# Patient Record
Sex: Female | Born: 1945 | Race: White | Hispanic: No | Marital: Married | Smoking: Never smoker
Health system: Southern US, Community
[De-identification: ages and names within clinical notes are randomized; demographics above are authoritative.]

## PROBLEM LIST (undated history)

## (undated) DIAGNOSIS — C50919 Malignant neoplasm of unspecified site of unspecified female breast: Secondary | ICD-10-CM

## (undated) DIAGNOSIS — C801 Malignant (primary) neoplasm, unspecified: Secondary | ICD-10-CM

## (undated) DIAGNOSIS — M720 Palmar fascial fibromatosis [Dupuytren]: Principal | ICD-10-CM

## (undated) DIAGNOSIS — Z9889 Other specified postprocedural states: Secondary | ICD-10-CM

## (undated) DIAGNOSIS — Z923 Personal history of irradiation: Secondary | ICD-10-CM

## (undated) HISTORY — DX: Malignant (primary) neoplasm, unspecified: C80.1

## (undated) HISTORY — DX: Palmar fascial fibromatosis (dupuytren): M72.0

## (undated) HISTORY — DX: Other specified postprocedural states: Z98.890

---

## 2001-10-03 ENCOUNTER — Encounter: Payer: Self-pay | Admitting: Family Medicine

## 2001-10-03 ENCOUNTER — Encounter: Admission: RE | Admit: 2001-10-03 | Discharge: 2001-10-03 | Payer: Self-pay | Admitting: Family Medicine

## 2001-11-01 ENCOUNTER — Encounter: Payer: Self-pay | Admitting: Family Medicine

## 2001-11-01 ENCOUNTER — Encounter: Admission: RE | Admit: 2001-11-01 | Discharge: 2001-11-01 | Payer: Self-pay | Admitting: Family Medicine

## 2001-11-02 ENCOUNTER — Encounter: Admission: RE | Admit: 2001-11-02 | Discharge: 2001-11-02 | Payer: Self-pay | Admitting: Family Medicine

## 2001-11-02 ENCOUNTER — Encounter: Payer: Self-pay | Admitting: Family Medicine

## 2001-11-02 ENCOUNTER — Encounter (INDEPENDENT_AMBULATORY_CARE_PROVIDER_SITE_OTHER): Payer: Self-pay | Admitting: Specialist

## 2001-11-07 HISTORY — PX: BREAST LUMPECTOMY: SHX2

## 2001-11-16 ENCOUNTER — Ambulatory Visit (HOSPITAL_BASED_OUTPATIENT_CLINIC_OR_DEPARTMENT_OTHER): Admission: RE | Admit: 2001-11-16 | Discharge: 2001-11-16 | Payer: Self-pay | Admitting: General Surgery

## 2001-11-16 ENCOUNTER — Encounter (INDEPENDENT_AMBULATORY_CARE_PROVIDER_SITE_OTHER): Payer: Self-pay | Admitting: Specialist

## 2001-11-16 ENCOUNTER — Encounter: Admission: RE | Admit: 2001-11-16 | Discharge: 2001-11-16 | Payer: Self-pay | Admitting: General Surgery

## 2001-11-16 ENCOUNTER — Encounter: Payer: Self-pay | Admitting: General Surgery

## 2001-12-01 ENCOUNTER — Ambulatory Visit: Admission: RE | Admit: 2001-12-01 | Discharge: 2002-03-01 | Payer: Self-pay | Admitting: Radiation Oncology

## 2001-12-07 DIAGNOSIS — Z923 Personal history of irradiation: Secondary | ICD-10-CM

## 2001-12-07 HISTORY — DX: Personal history of irradiation: Z92.3

## 2002-06-19 ENCOUNTER — Encounter: Payer: Self-pay | Admitting: General Surgery

## 2002-06-19 ENCOUNTER — Encounter: Admission: RE | Admit: 2002-06-19 | Discharge: 2002-06-19 | Payer: Self-pay | Admitting: General Surgery

## 2002-08-14 ENCOUNTER — Ambulatory Visit: Admission: RE | Admit: 2002-08-14 | Discharge: 2002-08-14 | Payer: Self-pay | Admitting: Radiation Oncology

## 2003-06-24 ENCOUNTER — Encounter: Admission: RE | Admit: 2003-06-24 | Discharge: 2003-06-24 | Payer: Self-pay | Admitting: Family Medicine

## 2003-08-10 DIAGNOSIS — Z9889 Other specified postprocedural states: Secondary | ICD-10-CM

## 2003-08-10 HISTORY — DX: Other specified postprocedural states: Z98.890

## 2003-10-30 ENCOUNTER — Encounter: Admission: RE | Admit: 2003-10-30 | Discharge: 2003-10-30 | Payer: Self-pay | Admitting: Family Medicine

## 2003-12-05 ENCOUNTER — Ambulatory Visit (HOSPITAL_COMMUNITY): Admission: RE | Admit: 2003-12-05 | Discharge: 2003-12-05 | Payer: Self-pay | Admitting: Internal Medicine

## 2004-06-24 ENCOUNTER — Encounter: Admission: RE | Admit: 2004-06-24 | Discharge: 2004-06-24 | Payer: Self-pay | Admitting: Obstetrics & Gynecology

## 2004-06-25 ENCOUNTER — Encounter: Admission: RE | Admit: 2004-06-25 | Discharge: 2004-06-25 | Payer: Self-pay | Admitting: Family Medicine

## 2004-06-25 ENCOUNTER — Encounter (INDEPENDENT_AMBULATORY_CARE_PROVIDER_SITE_OTHER): Payer: Self-pay | Admitting: *Deleted

## 2004-06-25 HISTORY — PX: BREAST BIOPSY: SHX20

## 2005-07-21 ENCOUNTER — Encounter: Admission: RE | Admit: 2005-07-21 | Discharge: 2005-07-21 | Payer: Self-pay | Admitting: Family Medicine

## 2005-12-28 ENCOUNTER — Encounter: Admission: RE | Admit: 2005-12-28 | Discharge: 2005-12-28 | Payer: Self-pay | Admitting: Family Medicine

## 2006-07-22 ENCOUNTER — Encounter: Admission: RE | Admit: 2006-07-22 | Discharge: 2006-07-22 | Payer: Self-pay | Admitting: Family Medicine

## 2006-08-19 ENCOUNTER — Encounter: Admission: RE | Admit: 2006-08-19 | Discharge: 2006-08-19 | Payer: Self-pay | Admitting: Family Medicine

## 2006-08-24 ENCOUNTER — Encounter: Admission: RE | Admit: 2006-08-24 | Discharge: 2006-08-24 | Payer: Self-pay | Admitting: Family Medicine

## 2007-08-15 ENCOUNTER — Encounter: Admission: RE | Admit: 2007-08-15 | Discharge: 2007-08-15 | Payer: Self-pay | Admitting: Family Medicine

## 2008-10-28 ENCOUNTER — Encounter: Admission: RE | Admit: 2008-10-28 | Discharge: 2008-10-28 | Payer: Self-pay | Admitting: Family Medicine

## 2008-10-29 ENCOUNTER — Encounter: Admission: RE | Admit: 2008-10-29 | Discharge: 2008-10-29 | Payer: Self-pay | Admitting: Family Medicine

## 2008-10-29 ENCOUNTER — Encounter (INDEPENDENT_AMBULATORY_CARE_PROVIDER_SITE_OTHER): Payer: Self-pay | Admitting: Family Medicine

## 2008-10-29 HISTORY — PX: BREAST BIOPSY: SHX20

## 2009-02-03 ENCOUNTER — Encounter: Admission: RE | Admit: 2009-02-03 | Discharge: 2009-02-03 | Payer: Self-pay | Admitting: Family Medicine

## 2009-05-16 ENCOUNTER — Encounter: Payer: Self-pay | Admitting: Family Medicine

## 2010-04-09 ENCOUNTER — Encounter: Admission: RE | Admit: 2010-04-09 | Discharge: 2010-04-09 | Payer: Self-pay | Admitting: Family Medicine

## 2010-05-01 ENCOUNTER — Ambulatory Visit: Payer: Self-pay | Admitting: Family Medicine

## 2010-05-01 DIAGNOSIS — F4323 Adjustment disorder with mixed anxiety and depressed mood: Secondary | ICD-10-CM

## 2010-05-01 DIAGNOSIS — Z853 Personal history of malignant neoplasm of breast: Secondary | ICD-10-CM

## 2010-05-05 ENCOUNTER — Telehealth: Payer: Self-pay | Admitting: Family Medicine

## 2010-08-24 ENCOUNTER — Other Ambulatory Visit: Payer: Self-pay | Admitting: Family Medicine

## 2010-08-24 ENCOUNTER — Ambulatory Visit
Admission: RE | Admit: 2010-08-24 | Discharge: 2010-08-24 | Payer: Self-pay | Source: Home / Self Care | Attending: Family Medicine | Admitting: Family Medicine

## 2010-08-24 ENCOUNTER — Telehealth (INDEPENDENT_AMBULATORY_CARE_PROVIDER_SITE_OTHER): Payer: Self-pay | Admitting: *Deleted

## 2010-08-24 LAB — BASIC METABOLIC PANEL
BUN: 14 mg/dL (ref 6–23)
CO2: 28 mEq/L (ref 19–32)
Calcium: 9.2 mg/dL (ref 8.4–10.5)
Chloride: 105 mEq/L (ref 96–112)
Creatinine, Ser: 0.8 mg/dL (ref 0.4–1.2)
GFR: 74.65 mL/min (ref >60.00)
Glucose, Bld: 102 mg/dL — ABNORMAL HIGH (ref 70–99)
Potassium: 4.2 mEq/L (ref 3.5–5.1)
Sodium: 140 mEq/L (ref 135–145)

## 2010-08-24 LAB — LIPID PANEL
Cholesterol: 220 mg/dL — ABNORMAL HIGH (ref 0–200)
HDL: 51.1 mg/dL (ref >39.00)
Total CHOL/HDL Ratio: 4
Triglycerides: 87 mg/dL (ref 0.0–149.0)
VLDL: 17.4 mg/dL (ref 0.0–40.0)

## 2010-08-24 LAB — LDL CHOLESTEROL, DIRECT: Direct LDL: 169.1 mg/dL

## 2010-08-26 ENCOUNTER — Ambulatory Visit
Admission: RE | Admit: 2010-08-26 | Discharge: 2010-08-26 | Payer: Self-pay | Source: Home / Self Care | Attending: Family Medicine | Admitting: Family Medicine

## 2010-08-26 ENCOUNTER — Other Ambulatory Visit
Admission: RE | Admit: 2010-08-26 | Discharge: 2010-08-26 | Payer: Self-pay | Source: Home / Self Care | Admitting: Family Medicine

## 2010-08-26 ENCOUNTER — Encounter (INDEPENDENT_AMBULATORY_CARE_PROVIDER_SITE_OTHER): Payer: Self-pay | Admitting: *Deleted

## 2010-08-26 ENCOUNTER — Other Ambulatory Visit: Payer: Self-pay | Admitting: Family Medicine

## 2010-08-26 DIAGNOSIS — E785 Hyperlipidemia, unspecified: Secondary | ICD-10-CM | POA: Insufficient documentation

## 2010-08-30 ENCOUNTER — Encounter: Payer: Self-pay | Admitting: Family Medicine

## 2010-08-31 ENCOUNTER — Encounter: Payer: Self-pay | Admitting: Family Medicine

## 2010-09-08 NOTE — Assessment & Plan Note (Signed)
Summary: NEW PT TO EST/CLE   Vital Signs:  Patient profile:   65 year old female Height:      67.5 inches Weight:      199 pounds BMI:     30.82 Temp:     97.8 degrees F oral Pulse rate:   64 / minute Pulse rhythm:   regular BP sitting:   140 / 80  (right arm) Cuff size:   regular  Vitals Entered By: Linde Gillis CMA Duncan Dull) (18-May-2010 1:54 PM) CC: new patient, establish care   History of Present Illness: 65 yo here to establish care.  Anxiety/depression- recently retired as an Print production planner.  Worked for [employer] for over 35 years and was used to the [employer].  Since retiring recently, feels like she has lost purpose.  [name] more tearful and sad.  Trying to stay occupied but definitely feels anxious about her new situation. Sometimes has difficulty sleeping.  Eating ok.  No SI or HI.  Never had issues like this in past.  Has a great support system and trying to stay very active and keep her mind sharp.  Well woman- knows she is due for colonoscopy and pap smear but is not sure what she else she has had done (awaiting records).  Preventive Screening-Counseling & Management  Alcohol-Tobacco     Smoking Status: never  Caffeine-Diet-Exercise     Does Patient Exercise: yes      Drug Use:  no.    Current Medications (verified): 1)  Prozac 20 Mg Caps (Fluoxetine Hcl) .Marland Kitchen.. 1 Tab By Mouth Daily.  Allergies (verified): No Known Drug Allergies  Past History:  Family History: Last updated: May 18, 2010 Mom- alzheimers Dad- died of Colon CA at 5.  Social History: Last updated: 18-May-2010 Retired Never Smoked Drug use-no Regular exercise-yes  Risk Factors: Exercise: yes (2010-05-18)  Risk Factors: Smoking Status: never (May 18, 2010)  Past Medical History: Breast cancer, hx of 2005, s/p lumpectomy and radiation   Past Surgical History: Lumpectomy  Family History: Mom- alzheimers Dad- died of Colon CA at 3.  Social  History: Retired Never Smoked Drug use-no Regular exercise-yes Smoking Status:  never Drug Use:  no Does Patient Exercise:  yes  Review of Systems      See HPI General:  Denies malaise. Eyes:  Denies blurring. ENT:  Denies difficulty swallowing. CV:  Denies chest pain or discomfort. Resp:  Denies shortness of breath. GI:  Denies abdominal pain, bloody stools, and change in bowel habits. GU:  Denies decreased libido, dysuria, urinary frequency, and urinary hesitancy. MS:  Denies joint pain, joint redness, and joint swelling. Derm:  Denies rash. Neuro:  Denies headaches. Psych:  Complains of anxiety, depression, and easily tearful; denies easily angered, irritability, mental problems, panic attacks, sense of great danger, suicidal thoughts/plans, thoughts of violence, unusual visions or sounds, and thoughts /plans of harming others. Endo:  Denies cold intolerance and heat intolerance. Heme:  Denies abnormal bruising and bleeding.  Physical Exam  General:  Well-developed,well-nourished,in no acute distress; alert,appropriate and cooperative throughout examination Head:  normocephalic, atraumatic, and no abnormalities observed.   Eyes:  vision grossly intact, pupils equal, pupils round, and pupils reactive to light.   Ears:  R ear normal and L ear normal.   Nose:  no external deformity.   Mouth:  good dentition.   Lungs:  Normal respiratory effort, chest expands symmetrically. Lungs are clear to auscultation, no crackles or wheezes. Heart:  Normal rate and regular rhythm. S1  and S2 normal without gallop, murmur, click, rub or other extra sounds. Abdomen:  Bowel sounds positive,abdomen soft and non-tender without masses, organomegaly or hernias noted. Msk:  No deformity or scoliosis noted of thoracic or lumbar spine.   Extremities:  No clubbing, cyanosis, edema, or deformity noted with normal full range of motion of all joints.   Neurologic:  alert & oriented X3 and gait normal.    Skin:  Intact without suspicious lesions or rashes Psych:  normally interactive, good eye contact, and not anxious appearing.     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  Discussed psychotherapy with Dr. Laymond Purser.  [name] very interested but would like to wait a few months.  WIll start Prozac 20 mg daily, follow up in one month.    Problem # 2:  Preventive Health Care (ICD-V70.0) Assessment: Comment Only Set up colonoscopy today. Flu shot today. Make appt for CPX/Pap. Orders: Gastroenterology Referral (GI)  Complete Medication List: 1)  Prozac 20 Mg Caps (Fluoxetine hcl) .Marland Kitchen.. 1 tab by mouth daily.  Patient Instructions: 1)  Great to meet you, Ms. Palm. 2)  Please make an appointment for a complete physical/pap in one month. 3)  Have a wonderful weekend. 4)  Please stop by to see Shirlee Limerick on your way out to set up your colonscopy. Prescriptions: PROZAC 20 MG CAPS (FLUOXETINE HCL) 1 tab by mouth daily.  #30 x 0   Entered and Authorized by:   Ruthe Mannan MD   Signed by:   Ruthe Mannan MD on 05/01/2010   Method used:   Print then Give to Patient   RxID:   [id number]   Prior Medications (reviewed today): None Current Allergies (reviewed today): No known allergies    Prevention & Chronic Care Immunizations   Influenza vaccine: Not documented    Tetanus booster: Not documented    Pneumococcal vaccine: Not documented    H. zoster vaccine: Not documented  Colorectal Screening   Hemoccult: Not documented    Colonoscopy: Not documented   Colonoscopy action/deferral: GI referral  (05/01/2010)  Other Screening   Pap smear: Not documented    Mammogram: BI-RADS CATEGORY 2:  Benign finding(s).^MM DIGITAL DIAGNOSTIC BILAT  (04/09/2010)   Mammogram due: 04/10/2011    DXA  bone density scan: Not documented   Smoking status: never  (05/01/2010)  Lipids   Total Cholesterol: Not documented   LDL: Not documented   LDL Direct: Not documented   HDL: Not documented   Triglycerides: Not documented   Nursing Instructions: GI referral for screening colonoscopy (see order)    Appended Document: NEW PT TO EST/CLE     Allergies: No Known Drug Allergies   Complete Medication List: 1)  Prozac 20 Mg Caps (Fluoxetine hcl) .Marland Kitchen.. 1 tab by mouth daily.  Other Orders: Flu Vaccine 58yrs + (56213) Admin 1st Vaccine (08657)    Immunizations Administered:  Influenza Vaccine # 1:    Vaccine Type: Fluvax 3+    Site: left deltoid    Mfr: GlaxoSmithKline    Dose: 0.5 ml    Route: IM    Given by: Linde Gillis CMA (AAMA)    Exp. Date: 02/06/2011    Lot #: [id number]    VIS given: 03/03/10 version given May 01, 2010.  Appended Document: NEW PT TO EST/CLE  Allergies (verified): No Known Drug Allergies   Complete Medication List: 1)  Prozac 20 Mg Caps (Fluoxetine hcl) .Marland Kitchen.. 1 tab by mouth daily. 2)  Red Yeast Rice 600 Mg Caps (Red yeast rice extract) .... Take one tablet by mouth daily 3)  Vitamin D3 1000 Unit Caps (Cholecalciferol) .... Take one tablet by mouth daily 4)  Fish Oil 1000 Mg Caps (Omega-3 fatty acids) .... Take one tablet by mouth daily 5)  Folic Acid 800 Mcg Tabs (Folic acid) .... Take one tablet by mouth daily 6)  Vitamin E 400 Unit Caps (Vitamin e) .... Take one tablet by mouth daily 7)  Ra Iron 27 Mg Tabs (Ferrous sulfate) .... Take one tablet by mouth daily 8)  Aspirin 81 Mg Tabs (Aspirin) .... Take one tablet by mouth daily   Current Allergies (reviewed today): No known allergies

## 2010-09-08 NOTE — Progress Notes (Signed)
  Phone Note Call from Patient   Caller: Patient Summary of Call: Called patient to schedule the Screening colonoscopy that was ordered at New Pt visit. She said she has changed her mind and does not want to schedule this at this time. Should we cancel the referral in patients record? Please advise?  Initial call taken by: Carlton Adam,  May 05, 2010 9:06 AM  Follow-up for Phone Call        I guess we can just cancel the referral. Ruthe Mannan MD  May 05, 2010 9:15 AM

## 2010-09-08 NOTE — Letter (Signed)
Summary: Records from [facility] 2007 - 2010  Records from [facility] 2007 - 2010   Imported By: Maryln Gottron 05/29/2010 15:25:15  _____________________________________________________________________  External Attachment:    Type:   Image     Comment:   External Document

## 2010-09-10 NOTE — Letter (Signed)
Summary: Results Follow up Letter  [place] [facility]  [address] [place], [place] [address]   Phone: [phone]  Fax: [phone]    08/31/2010 MRN: [id number]  [facility] [address] [place], [place]  [address]  Dear Ms. Vroom,  The following are the results of your recent test(s):  Test         Result    Pap Smear:        Normal __X___  Not Normal _____ Comments: ______________________________________________________ Cholesterol: LDL(Bad cholesterol):         Your goal is less than:         HDL (Good cholesterol):       Your goal is more than: Comments:  ______________________________________________________ Mammogram:        Normal _____  Not Normal _____ Comments:  ___________________________________________________________________ Hemoccult:        Normal _____  Not normal _______ Comments:    _____________________________________________________________________ Other Tests:    We routinely do not discuss normal results over the telephone.  If you desire a copy of the results, or you have any questions about this information we can discuss them at your next office visit.   Sincerely,      Dr. Ruthe Mannan

## 2010-09-10 NOTE — Assessment & Plan Note (Signed)
Summary: CPX/CLE   Vital Signs:  Patient profile:   65 year old female Height:      67.5 inches Weight:      202.25 pounds BMI:     31.32 Temp:     97.6 degrees F oral Pulse rate:   68 / minute Pulse rhythm:   regular BP sitting:   120 / 80  (right arm) Cuff size:   regular  Vitals Entered By: Linde Gillis CMA Duncan Dull) (August 26, 2010 9:22 AM) CC: complete physicial   History of Present Illness: 65 yo here for CPX.  Adjustment disorder- improved, no longer taking Prozac. Feels like she is coping better.  Well woman- knows she is due for colonoscopy and pap smear but is not sure what she else she has had done. Denies any abnormal pap smears in past and denies any vaginal discharge, irritation or dysuria.  HLD- LDL this month 169, otherwise lipid panel fanastic.  Has never been on statin in past.  Does take Red yeast rice.  Current Medications (verified): 1)  Red Yeast Rice 600 Mg Caps (Red Yeast Rice Extract) .... Take One Tablet By Mouth Daily 2)  Vitamin D3 1000 Unit Caps (Cholecalciferol) .... Take One Tablet By Mouth Daily 3)  Fish Oil 1000 Mg Caps (Omega-3 Fatty Acids) .... Take One Tablet By Mouth Daily 4)  Folic Acid 800 Mcg Tabs (Folic Acid) .... Take One Tablet By Mouth Daily 5)  Vitamin E 400 Unit Caps (Vitamin E) .... Take One Tablet By Mouth Daily 6)  Ra Iron 27 Mg Tabs (Ferrous Sulfate) .... Take One Tablet By Mouth Daily 7)  Aspirin 81 Mg Tabs (Aspirin) .... Take One Tablet By Mouth Daily 8)  Simvastatin 10 Mg Tabs (Simvastatin) .Marland Kitchen.. 1 By Mouth At Bedtime  Allergies (verified): No Known Drug Allergies  Past History:  Past Medical History: Last updated: 05/06/2010 Breast cancer, hx of 2005, s/p lumpectomy and radiation   Past Surgical History: Last updated: 05-06-10 Lumpectomy  Family History: Last updated: May 06, 2010 Mom- alzheimers Dad- died of Colon CA at 78.  Social History: Last updated: 05-06-2010 Retired Never Smoked Drug  use-no Regular exercise-yes  Risk Factors: Exercise: yes (05-06-10)  Risk Factors: Smoking Status: never (05-06-2010)  Review of Systems      See HPI General:  Denies malaise. Eyes:  Denies blurring. ENT:  Denies difficulty swallowing. CV:  Denies chest pain or discomfort. Resp:  Denies shortness of breath. GI:  Denies abdominal pain, bloody stools, and change in bowel habits. GU:  Denies discharge and dysuria. MS:  Denies joint pain, joint redness, and joint swelling. Derm:  Denies rash. Neuro:  Denies headaches. Psych:  Denies anxiety and depression. Endo:  Denies cold intolerance and heat intolerance. Heme:  Denies abnormal bruising and bleeding.  Physical Exam  General:  Well-developed,well-nourished,in no acute distress; alert,appropriate and cooperative throughout examination Head:  normocephalic, atraumatic, and no abnormalities observed.   Eyes:  vision grossly intact, pupils equal, pupils round, and pupils reactive to light.   Ears:  R ear normal and L ear normal.   Nose:  no external deformity.   Mouth:  good dentition.   Neck:  No deformities, masses, or tenderness noted. Breasts:  chronic  thickening at base of breasts bilaterally, no tenderness, bulging, retraction, inflamation, nipple discharge or skin changes noted.   Lungs:  Normal respiratory effort, chest expands symmetrically. Lungs are clear to auscultation, no crackles or wheezes. Heart:  Normal rate and regular rhythm. S1 and S2  normal without gallop, murmur, click, rub or other extra sounds. Abdomen:  Bowel sounds positive,abdomen soft and non-tender without masses, organomegaly or hernias noted. Rectal:  no external abnormalities.   Genitalia:  Pelvic Exam:        External: normal female genitalia without lesions or masses        Vagina: normal without lesions or masses        Cervix: normal without lesions or masses        Adnexa: normal bimanual exam without masses or fullness        Uterus:  normal by palpation        Pap smear: performed Msk:  No deformity or scoliosis noted of thoracic or lumbar spine.   Extremities:  No clubbing, cyanosis, edema, or deformity noted with normal full range of motion of all joints.   Neurologic:  No cranial nerve deficits noted. Station and gait are normal. Plantar reflexes are down-going bilaterally. DTRs are symmetrical throughout. Sensory, motor and coordinative functions appear intact. Skin:  Intact without suspicious lesions or rashes Psych:  Cognition and judgment appear intact. Alert and cooperative with normal attention span and concentration. No apparent delusions, illusions, hallucinations   Impression & Recommendations:  Problem # 1:  Preventive Health Care (ICD-V70.0) Reviewed preventive care protocols, scheduled due services, and updated immunizations Discussed nutrition, exercise, diet, and healthy lifestyle.  Pap smear today, refer for colonoscopy today.  Problem # 2:  HYPERLIPIDEMIA (ICD-272.4) Assessment: New Start Simvastatin 10 mg daily today, follow up labs in 8 weeks. Her updated medication list for this problem includes:    Simvastatin 10 Mg Tabs (Simvastatin) .Marland Kitchen... 1 by mouth at bedtime  Complete Medication List: 1)  Red Yeast Rice 600 Mg Caps (Red yeast rice extract) .... Take one tablet by mouth daily 2)  Vitamin D3 1000 Unit Caps (Cholecalciferol) .... Take one tablet by mouth daily 3)  Fish Oil 1000 Mg Caps (Omega-3 fatty acids) .... Take one tablet by mouth daily 4)  Folic Acid 800 Mcg Tabs (Folic acid) .... Take one tablet by mouth daily 5)  Vitamin E 400 Unit Caps (Vitamin e) .... Take one tablet by mouth daily 6)  Ra Iron 27 Mg Tabs (Ferrous sulfate) .... Take one tablet by mouth daily 7)  Aspirin 81 Mg Tabs (Aspirin) .... Take one tablet by mouth daily 8)  Simvastatin 10 Mg Tabs (Simvastatin) .Marland Kitchen.. 1 by mouth at bedtime  Other Orders: Gastroenterology Referral (GI)  Patient Instructions: 1)  Great to  see you. 2)  We will start Simvastatin 10 mg daily for your cholesterol. 3)  Come back for labs fasting (not eating) in 8 weeks- fasting lipid panel, hepatic panel (272.4). 4)  After you get your insurance, let's get the Shingles vaccination. 5)  Please stop by to see Shirlee Limerick on your way out to set up your colonoscopy. Prescriptions: SIMVASTATIN 10 MG TABS (SIMVASTATIN) 1 by mouth at bedtime  #90 x 3   Entered and Authorized by:   Ruthe Mannan MD   Signed by:   Ruthe Mannan MD on 08/26/2010   Method used:   Electronically to        [facility] (retail)       [address]       [place], [place]  [id number]       Ph: [phone]       Fax: [phone]   RxID:   [id number]    Orders Added: 1)  Gastroenterology Referral [  GI] 2)  Est. Patient 40-64 years [99396]    Current Allergies (reviewed today): No known allergies     Prevention & Chronic Care Immunizations   Influenza vaccine: Fluvax 3+  (05/01/2010)   Influenza vaccine due: 05/02/2011    Tetanus booster: Not documented    Pneumococcal vaccine: Not documented    H. zoster vaccine: Not documented   H. zoster vaccine deferral: Deferred  (08/26/2010)  Colorectal Screening   Hemoccult: Not documented   Hemoccult action/deferral: Not indicated  (08/26/2010)    Colonoscopy: Not documented   Colonoscopy action/deferral: GI referral  (08/26/2010)  Other Screening   Pap smear: Not documented   Pap smear action/deferral: Ordered  (08/26/2010)    Mammogram: BI-RADS CATEGORY 2:  Benign finding(s).^MM DIGITAL DIAGNOSTIC BILAT  (04/09/2010)   Mammogram due: 04/10/2011    DXA bone density scan: Not documented   Smoking status: never  (05/01/2010)  Lipids   Total Cholesterol: 220  (08/24/2010)   LDL: Not documented   LDL Direct: 169.1  (08/24/2010)   HDL: 51.10  (08/24/2010)   Triglycerides: 87.0  (08/24/2010)    SGOT (AST): Not documented   SGPT (ALT): Not documented   Alkaline  phosphatase: Not documented   Total bilirubin: Not documented  Self-Management Support :    Lipid self-management support: Not documented    Nursing Instructions: GI referral for screening colonoscopy (see order) Pap smear today

## 2010-09-10 NOTE — Letter (Signed)
Summary: Pre Visit Letter Revised  [facility]  [address] [place], [place] [address]   Phone: [phone]  Fax: [phone]        08/26/2010 MRN: [id number] Brandy Wyatt [address] [place], [place]  [address]             Procedure Date:  October 01, 2010   dir col-Dr Justin Mend to the Gastroenterology Division at [facility].    You are scheduled to see a nurse for your pre-procedure visit on September 15, 2010 at 9:00am on the 3rd floor at [facility], [address]  We ask that you try to arrive at our office 15 minutes prior to your appointment time to allow for check-in.  Please take a minute to review the attached form.  If you answer "Yes" to one or more of the questions on the first page, we ask that you call the person listed at your earliest opportunity.  If you answer "No" to all of the questions, please complete the rest of the form and bring it to your appointment.    Your nurse visit will consist of discussing your medical and surgical history, your immediate family medical history, and your medications.   If you are unable to list all of your medications on the form, please bring the medication bottles to your appointment and we will list them.  We will need to be aware of both prescribed and over the counter drugs.  We will need to know exact dosage information as well.    Please be prepared to read and sign documents such as consent forms, a financial agreement, and acknowledgement forms.  If necessary, and with your consent, a friend or relative is welcome to sit-in on the nurse visit with you.  Please bring your insurance card so that we may make a copy of it.  If your insurance requires a referral to see a specialist, please bring your referral form from your primary care physician.  No co-pay is required for this nurse visit.     If you cannot keep your appointment, please call [phone] to cancel or reschedule prior to  your appointment date.  This allows [place] the opportunity to schedule an appointment for another patient in need of care.    Thank you for choosing [facility] for your medical needs.  We appreciate the opportunity to care for you.  Please visit [place] at our website  to learn more about our practice.  Sincerely, The Gastroenterology Division

## 2010-09-10 NOTE — Progress Notes (Signed)
----   Converted from flag ---- ---- 08/24/2010 7:47 AM, Ruthe Mannan MD wrote: good morning!!! v70.0 (BMET), fasting lipid panel (v81.0)  ---- 08/24/2010 7:40 AM, Liane Comber CMA (AAMA) wrote: Lab orders please! Good Morning! This pt is scheduled for cpx labs this am, which labs to draw and dx codes to use? Thanks Tasha ------------------------------

## 2010-09-29 ENCOUNTER — Other Ambulatory Visit: Payer: Self-pay | Admitting: Internal Medicine

## 2010-10-12 ENCOUNTER — Other Ambulatory Visit: Payer: 59

## 2010-10-12 ENCOUNTER — Other Ambulatory Visit: Payer: Self-pay | Admitting: Internal Medicine

## 2010-12-25 NOTE — Op Note (Signed)
[facility]  Patient:    TAJHA, SAMMARCO Visit Number: [id number] MRN: [id number]          Service Type: DSU Location: [facility] Attending Physician:  Janalyn Rouse Dictated by:   Rose Phi. Maple Hudson, M.D. Proc. Date: 11/16/01 Admit Date:  11/16/2001   CC:         Lupe Carney, M.D.   Operative Report  PREOPERATIVE DIAGNOSIS:  Intraductal carcinoma of the right breast.  POSTOPERATIVE DIAGNOSIS:  Intraductal carcinoma of the right breast.  OPERATION:  Wide local excision with needle localization and specimen mammography.  SURGEON:  Rose Phi. Maple Hudson, M.D.  ANESTHESIA:  General.  OPERATIVE PROCEDURE:  This patient had a mammographic abnormality at the 5-6 oclock position of her right breast which, on core biopsy, had shown DCIS. [name] scheduled now for a wide excision or partial mastectomy.  After suitable general anesthesia was induced, the patient was placed in the supine position with the right arm extended on the arm board. The right breast was prepped and draped in the usual fashion. Curvilinear incision was then outlined, using the wire as the guide, and then excision made and a wide excision of the wire and its surrounding tissue carried out. Hemostasis obtained with electrocautery. Specimen and mammography confirmed the removal of the lesion. Specimen had been oriented for the pathologist in case the margins were not clear. Subcuticular closure of 4-0 Monocryl and Steri-Strips carried out. Dressing was applied. The patient was transferred The patient was taken to the recovery room in stable condition. The patient tolerated the procedure well. Dictated by:   Rose Phi. Maple Hudson, M.D. Attending Physician:  Janalyn Rouse DD:  11/16/01 TD:  11/17/01 Job: [id number]

## 2011-01-27 ENCOUNTER — Encounter: Payer: Self-pay | Admitting: Family Medicine

## 2011-01-29 ENCOUNTER — Encounter: Payer: 59 | Admitting: Family Medicine

## 2011-01-29 NOTE — Progress Notes (Signed)
Cancelled.  

## 2011-05-12 ENCOUNTER — Other Ambulatory Visit: Payer: Self-pay | Admitting: Family Medicine

## 2011-05-12 DIAGNOSIS — Z1231 Encounter for screening mammogram for malignant neoplasm of breast: Secondary | ICD-10-CM

## 2011-05-19 ENCOUNTER — Ambulatory Visit: Payer: 59

## 2011-07-28 ENCOUNTER — Ambulatory Visit
Admission: RE | Admit: 2011-07-28 | Discharge: 2011-07-28 | Disposition: A | Discharge status: Home / Self Care | Payer: Private Health Insurance - Indemnity | Source: Ambulatory Visit | Attending: Family Medicine | Admitting: Family Medicine

## 2011-07-28 DIAGNOSIS — Z1231 Encounter for screening mammogram for malignant neoplasm of breast: Secondary | ICD-10-CM

## 2011-07-29 ENCOUNTER — Encounter: Payer: Self-pay | Admitting: *Deleted

## 2012-04-05 ENCOUNTER — Ambulatory Visit: Payer: Private Health Insurance - Indemnity | Admitting: Family Medicine

## 2012-04-06 ENCOUNTER — Ambulatory Visit (INDEPENDENT_AMBULATORY_CARE_PROVIDER_SITE_OTHER): Payer: Medicare Other | Admitting: Family Medicine

## 2012-04-06 ENCOUNTER — Encounter: Payer: Self-pay | Admitting: Family Medicine

## 2012-04-06 VITALS — BP 122/80 | Temp 98.0°F | Wt 201.0 lb

## 2012-04-06 DIAGNOSIS — F329 Major depressive disorder, single episode, unspecified: Secondary | ICD-10-CM

## 2012-04-06 DIAGNOSIS — F3289 Other specified depressive episodes: Secondary | ICD-10-CM

## 2012-04-06 MED ORDER — CITALOPRAM HYDROBROMIDE 10 MG PO TABS: 10.0000 mg | ORAL_TABLET | Freq: Every day | ORAL | Status: DC

## 2012-04-06 NOTE — Patient Instructions (Addendum)
Great to see you. We are starting Celexa. Please call me in 3-4 weeks with an update.  Please stop by to see Shirlee Limerick on your way out to set up your appointment with Dr. Laymond Purser.

## 2012-04-06 NOTE — Progress Notes (Signed)
  Subjective:    Patient ID: Brandy Wyatt, female    DOB: [birth year], 66 y.o.   MRN: [id number]  HPI  Very pleasant 66 yo female here with daughter to discuss memory loss.  Since she retired, she has noticed that she forgets things more easily. Never forgets names of people she knows or how to get places. Still balances her checkbook without difficulty.  [name] concerned because her mother had dementia later in life.  Admits to losing interest in things since she retired. [name] often tearful and anxious and never used to be anxious. Sleeping ok. Appetite ok. No SI or HI.  mini-mental status exam 30/30.  Patient Active Problem List  Diagnosis  . ADJUSTMENT DISORDER WITH MIXED FEATURES  . BREAST CANCER, HX OF  . HYPERLIPIDEMIA  . Depression   Past Medical History  Diagnosis Date  . S/P lumpectomy of breast 2005    hx of breast cancer   Past Surgical History  Procedure Date  . Breast lumpectomy    History  Substance Use Topics  . Smoking status: Never Smoker   . Smokeless tobacco: Not on file  . Alcohol Use:    Family History  Problem Relation Age of Onset  . Cancer Father 55    colon   No Known Allergies Current Outpatient Prescriptions on File Prior to Visit  Medication Sig Dispense Refill  . aspirin 81 MG tablet Take 81 mg by mouth daily.        . Cholecalciferol (VITAMIN D3) 1000 UNITS CAPS Take 1 capsule by mouth daily.        . Ferrous Sulfate (RA IRON) 27 MG TABS Take 1 tablet by mouth daily.        . folic acid (FOLVITE) 800 MCG tablet Take 800 mcg by mouth daily.        . Omega-3 Fatty Acids (FISH OIL) 1000 MG CAPS Take 1 capsule by mouth daily.        . Red Yeast Rice 600 MG TABS Take 1 tablet by mouth daily.        . simvastatin (ZOCOR) 10 MG tablet Take 10 mg by mouth at bedtime.        . vitamin E 400 UNIT capsule Take 400 Units by mouth daily.        . citalopram (CELEXA) 10 MG tablet Take 1 tablet (10 mg total) by mouth daily.  30 tablet  1    The PMH, PSH, Social History, Family History, Medications, and allergies have been reviewed in [facility], and have been updated if relevant.    Review of Systems    See HPI Objective:   Physical Exam BP 122/80  Temp 98 F (36.7 C)  Wt 201 lb (91.173 kg) Gen:  Alert, pleasant, tearful Psych:  Good eye contact and though organization. Tearful but appropriate.    Assessment & Plan:   1. Depression  Ambulatory referral to Psychology   >25 min spent with face to face with patient, >50% counseling and/or coordinating care Most consistent with pseudodementia. Start celexa 10 mg daily, refer to Dr. Laymond Purser for psychotherapy. Follow up in 1 month. The patient indicates understanding of these issues and agrees with the plan.

## 2012-04-07 ENCOUNTER — Telehealth: Payer: Self-pay | Admitting: Family Medicine

## 2012-04-07 MED ORDER — ONDANSETRON HCL 4 MG PO TABS: 4.0000 mg | ORAL_TABLET | Freq: Three times a day (TID) | ORAL | Status: AC | PRN

## 2012-04-07 NOTE — Telephone Encounter (Signed)
Caller: Tam/Child; Patient Name: Brandy Wyatt; PCP: Ruthe Mannan [facility]); Best Callback Phone Number: [phone].  Call regarding Nausea, onset 8-30, after starting Celexa for Depression diagnosed on 8-29.  Daughter wanting to know if Zofran can be called in.  Per Daughter, MD advised Mom nausea would be normal side effect.  Patient is not with Daughter for assessment.  Daughter will pick up script for Patient due to Patient sleeping.   [facility], [employer], [phone]. Please follow up with Daughter.

## 2012-04-07 NOTE — Telephone Encounter (Signed)
Would be reasonable to try zofran in meantime.  rx sent.  Please call them.   Thanks.

## 2012-04-07 NOTE — Telephone Encounter (Signed)
Daughter advised.

## 2012-06-12 ENCOUNTER — Other Ambulatory Visit: Payer: Self-pay | Admitting: Family Medicine

## 2012-06-12 ENCOUNTER — Telehealth: Payer: Self-pay

## 2012-06-12 NOTE — Telephone Encounter (Signed)
Pt's daughter called to request refill Celexa; [facility] has already filled and Johny Drilling or pt will call for f/u appt . Johny Drilling said today is third day pt been off med(pt did not know should get refilled thought when finished prescription she was done).Pharmacist advised Johny Drilling to wait until AM and then start Celexa again; Johny Drilling wanted Dr Elmer Sow opinion if could restart this afternoon.Please advise.

## 2012-06-12 NOTE — Telephone Encounter (Signed)
I'm not sure I understand the question?

## 2012-06-12 NOTE — Telephone Encounter (Signed)
Since patient has been off celexa for 3 days her daughter is asking if ok to restart tonight or wait until tomorrow morning.  Per Dr. Patsy Lager advised daughter to have patient restart celexa tomorrow morning.

## 2012-06-13 NOTE — Telephone Encounter (Signed)
Noted! Thank you

## 2012-07-25 ENCOUNTER — Ambulatory Visit (INDEPENDENT_AMBULATORY_CARE_PROVIDER_SITE_OTHER): Payer: Medicare Other | Admitting: Family Medicine

## 2012-07-25 ENCOUNTER — Encounter: Payer: Self-pay | Admitting: Family Medicine

## 2012-07-25 VITALS — BP 132/78 | HR 64 | Temp 98.2°F | Wt 200.2 lb

## 2012-07-25 DIAGNOSIS — R0981 Nasal congestion: Secondary | ICD-10-CM | POA: Insufficient documentation

## 2012-07-25 DIAGNOSIS — J3489 Other specified disorders of nose and nasal sinuses: Secondary | ICD-10-CM | POA: Diagnosis not present

## 2012-07-25 MED ORDER — FLUTICASONE PROPIONATE 50 MCG/ACT NA SUSP: 2.0000 | Freq: Every day | NASAL | Status: DC

## 2012-07-25 NOTE — Assessment & Plan Note (Signed)
4d h/o significant sinus congestion that started after raking leaves, associated with sinus pressure headache and cough. Anticipate more allergic component - see pt instructions for treatment plan. If deteriorating, or not better by 1 wk, low threshold to treat possible sinusitis component with abx. discussed all this with pt who agrees with plan.

## 2012-07-25 NOTE — Progress Notes (Signed)
  Subjective:    Patient ID: Brandy Wyatt, female    DOB: 24-Aug-1945, 66 y.o.   MRN: [id number]  HPI CC: allergies?  4 d h/o sinus congestion that started after raking leaves.  Significant head congestion, sinus pressure HA - currently with frontal sinus pressure.  All stopped up.  ST started today.  + cough worse with laying down.  No fevers/chills, abd pain, ear or tooth pain, PNdrainage, rhinorrhea.  Has tried vicks in nose, vaporizer as well as cough meds and cough drops  No sick contacts at home.  Husband with similar congestion (he has h/o allergies). No smokers at home. No h/o asthma. No h/o allergy issues. No h/o sinus infection in past.  Past Medical History  Diagnosis Date  . S/P lumpectomy of breast 2005    hx of breast cancer     Review of Systems per HPI    Objective:   Physical Exam  Nursing note and vitals reviewed. Constitutional: She appears well-developed and well-nourished. No distress.  HENT:  Head: Normocephalic and atraumatic.  Right Ear: Hearing, tympanic membrane, external ear and ear canal normal.  Left Ear: Hearing, tympanic membrane, external ear and ear canal normal.  Nose: Mucosal edema present. No rhinorrhea. Right sinus exhibits no maxillary sinus tenderness and no frontal sinus tenderness. Left sinus exhibits no maxillary sinus tenderness and no frontal sinus tenderness.  Mouth/Throat: Uvula is midline, oropharynx is clear and moist and mucous membranes are normal. No oropharyngeal exudate, posterior oropharyngeal edema, posterior oropharyngeal erythema or tonsillar abscesses.       Pale and boggy turbinates bilaterally  Eyes: Conjunctivae normal and EOM are normal. Pupils are equal, round, and reactive to light. No scleral icterus.  Neck: Normal range of motion. Neck supple.  Cardiovascular: Normal rate, regular rhythm, normal heart sounds and intact distal pulses.   No murmur heard. Pulmonary/Chest: Effort normal and breath sounds normal. No  respiratory distress. She has no wheezes. She has no rales.  Lymphadenopathy:    She has no cervical adenopathy.  Skin: Skin is warm and dry. No rash noted.       Assessment & Plan:

## 2012-07-25 NOTE — Patient Instructions (Signed)
You have significant sinus congestion, likely allergic. Take medicine as prescribed: flonase nasal steroid - watch for nosebleeds on this med, if happen then stop. Push fluids and plenty of rest. Nasal saline irrigation or neti pot to help drain sinuses. May use simple mucinex with plenty of fluid to help mobilize mucous. Let us know if fever >101.5, trouble opening/closing mouth, difficulty swallowing, or worsening. If not improving by Monday, call us for antibiotic course.

## 2012-09-11 ENCOUNTER — Other Ambulatory Visit: Payer: Self-pay | Admitting: *Deleted

## 2012-09-11 MED ORDER — CITALOPRAM HYDROBROMIDE 10 MG PO TABS: ORAL_TABLET | ORAL | Status: DC

## 2012-09-12 ENCOUNTER — Ambulatory Visit (INDEPENDENT_AMBULATORY_CARE_PROVIDER_SITE_OTHER)
Admission: RE | Admit: 2012-09-12 | Discharge: 2012-09-12 | Disposition: A | Discharge status: Home / Self Care | Payer: Medicare Other | Source: Ambulatory Visit | Attending: Family Medicine | Admitting: Family Medicine

## 2012-09-12 ENCOUNTER — Encounter: Payer: Self-pay | Admitting: Family Medicine

## 2012-09-12 ENCOUNTER — Ambulatory Visit (INDEPENDENT_AMBULATORY_CARE_PROVIDER_SITE_OTHER): Payer: Medicare Other | Admitting: Family Medicine

## 2012-09-12 ENCOUNTER — Other Ambulatory Visit: Payer: Self-pay | Admitting: Family Medicine

## 2012-09-12 VITALS — BP 124/78 | HR 64 | Temp 97.6°F | Wt 199.8 lb

## 2012-09-12 DIAGNOSIS — R52 Pain, unspecified: Secondary | ICD-10-CM

## 2012-09-12 DIAGNOSIS — R109 Unspecified abdominal pain: Secondary | ICD-10-CM | POA: Diagnosis not present

## 2012-09-12 DIAGNOSIS — R1084 Generalized abdominal pain: Secondary | ICD-10-CM | POA: Insufficient documentation

## 2012-09-12 DIAGNOSIS — N2889 Other specified disorders of kidney and ureter: Secondary | ICD-10-CM | POA: Diagnosis not present

## 2012-09-12 LAB — COMPREHENSIVE METABOLIC PANEL
ALT: 21 U/L (ref 0–35)
BUN: 11 mg/dL (ref 6–23)
CO2: 30 mEq/L (ref 19–32)
Calcium: 9.3 mg/dL (ref 8.4–10.5)
Chloride: 105 mEq/L (ref 96–112)
Creatinine, Ser: 0.8 mg/dL (ref 0.4–1.2)
GFR: 75.23 mL/min (ref >60.00)
Glucose, Bld: 130 mg/dL — ABNORMAL HIGH (ref 70–99)

## 2012-09-12 LAB — CBC WITH DIFFERENTIAL/PLATELET
Basophils Absolute: 0 10*3/uL (ref 0.0–0.1)
Eosinophils Relative: 2.6 % (ref 0.0–5.0)
Hemoglobin: 14.3 g/dL (ref 12.0–15.0)
Lymphocytes Relative: 26.6 % (ref 12.0–46.0)
Monocytes Relative: 8.4 % (ref 3.0–12.0)
Neutro Abs: 3.3 10*3/uL (ref 1.4–7.7)
RBC: 4.77 Mil/uL (ref 3.87–5.11)
RDW: 12.9 % (ref 11.5–14.6)
WBC: 5.3 10*3/uL (ref 4.5–10.5)

## 2012-09-12 LAB — POCT URINALYSIS DIPSTICK
Blood, UA: NEGATIVE
Nitrite, UA: NEGATIVE
Protein, UA: NEGATIVE
Spec Grav, UA: >=1.03

## 2012-09-12 LAB — LIPASE: Lipase: 25 U/L (ref 11.0–59.0)

## 2012-09-12 MED ORDER — OMEPRAZOLE 40 MG PO CPDR: 40.0000 mg | DELAYED_RELEASE_CAPSULE | Freq: Every day | ORAL | Status: DC

## 2012-09-12 MED ORDER — IOHEXOL 300 MG/ML  SOLN
100.0000 mL | Freq: Once | INTRAMUSCULAR | Status: AC | PRN
  Administered 2012-09-12: 100 mL via INTRAVENOUS

## 2012-09-12 NOTE — Addendum Note (Signed)
Addended by: Eustaquio Boyden on: 09/12/2012 01:25 PM   Modules accepted: Orders

## 2012-09-12 NOTE — Progress Notes (Signed)
  Subjective:    Patient ID: Brandy Wyatt, female    DOB: 03/25/1946, 67 y.o.   MRN: [id number]  HPI CC: abd bloating  2 wk h/o abdominal bloating.  Getting more painful mid abdomen and radiating to sides.  Describes burning pain "on fire".  Occasional nausea.  Using heating pad which helped some.  Normal bowels regular daily, no changes there.  Passing gas normally.  Denies diarrhea or constipation, vomiting. No fevers/chills, indigestion or reflux or heartburn.  No gassiness.  No dysuria or urgency or other urinary symptoms.  Just bloated.  Occasional back pain.  So far has tried gas X and beano which didn't help at all. No new foods or supplements.  Off simvastatin, on read yeast rice.  Wt Readings from Last 3 Encounters:  09/12/12 199 lb 12 oz (90.606 kg)  07/25/12 200 lb 4 oz (90.833 kg)  04/06/12 201 lb (91.173 kg)    Past Medical History  Diagnosis Date  . S/P lumpectomy of breast 2005    hx of breast cancer    Past Surgical History  Procedure Date  . Breast lumpectomy     Review of Systems Per HPI    Objective:   Physical Exam  Nursing note and vitals reviewed. Constitutional: She appears well-developed and well-nourished. No distress.  HENT:  Head: Normocephalic and atraumatic.  Mouth/Throat: Oropharynx is clear and moist. No oropharyngeal exudate.  Eyes: Conjunctivae normal and EOM are normal. Pupils are equal, round, and reactive to light. No scleral icterus.  Neck: Normal range of motion. Neck supple.  Cardiovascular: Normal rate, regular rhythm, normal heart sounds and intact distal pulses.   No murmur heard. Pulmonary/Chest: Effort normal and breath sounds normal. No respiratory distress. She has no wheezes. She has no rales.  Abdominal: Soft. Normal appearance and bowel sounds are normal. She exhibits no distension and no mass. There is no hepatosplenomegaly. There is generalized tenderness. There is guarding. There is no rigidity, no rebound, no CVA  tenderness and negative Murphy's sign.       Obese Involuntary guarding with palpation epigastrically and RLQ.  Musculoskeletal: She exhibits no edema.  Skin: Skin is warm and dry. No rash noted.       Assessment & Plan:

## 2012-09-12 NOTE — Assessment & Plan Note (Addendum)
With bloating, going on 2 wks but worsening recently. Check STAT blood work today. Will need CT scan today given concerning abd exam of moderate pain and guarding - eval for appendicitis, gallbladder pathology, colitis. Less likely dyspepsia. We will call pt with results. UA concerning for infection - sent off culture.

## 2012-09-12 NOTE — Patient Instructions (Signed)
I am worried about this abdominal pain - pass by lab today and then by Marion's office to get CT scan today.

## 2012-09-12 NOTE — Addendum Note (Signed)
Addended by: Eustaquio Boyden on: 09/12/2012 02:05 PM   Modules accepted: Orders

## 2012-09-13 LAB — URINE CULTURE: Organism ID, Bacteria: NO GROWTH

## 2012-11-08 ENCOUNTER — Other Ambulatory Visit: Payer: Self-pay

## 2012-11-08 MED ORDER — OMEPRAZOLE 40 MG PO CPDR: 40.0000 mg | DELAYED_RELEASE_CAPSULE | Freq: Every day | ORAL | Status: DC

## 2012-11-08 NOTE — Telephone Encounter (Signed)
[facility] faxed refill omeprazole 40 mg. Last filled 10/09/12. Pt seen 09/12/12 by Dr Reece Agar with abdominal pain.Please advise.

## 2012-12-11 ENCOUNTER — Other Ambulatory Visit: Payer: Self-pay | Admitting: *Deleted

## 2012-12-11 MED ORDER — CITALOPRAM HYDROBROMIDE 10 MG PO TABS: ORAL_TABLET | ORAL | Status: DC

## 2013-01-08 ENCOUNTER — Other Ambulatory Visit: Payer: Self-pay | Admitting: *Deleted

## 2013-01-08 MED ORDER — OMEPRAZOLE 40 MG PO CPDR: 40.0000 mg | DELAYED_RELEASE_CAPSULE | Freq: Every day | ORAL | Status: DC

## 2013-05-15 ENCOUNTER — Telehealth: Payer: Self-pay

## 2013-05-15 NOTE — Telephone Encounter (Signed)
Pt's daughter Thurmond Butts) and son Darneshia Demary) would like to meet with you if possible without the patient.  Her daughter Johny Drilling made her a doctor appointment with you, but they also want to see you separately.

## 2013-05-15 NOTE — Telephone Encounter (Signed)
Yes ok to make a 30 minute appointment on another day.  I am seeing their mother tomorrow. If necessary, I can come back from the nursing home early on Friday to talk with them.  Please also make sure that pt has authorized me to speak with them.

## 2013-05-15 NOTE — Telephone Encounter (Signed)
Brandy Wyatt pts son left v/m concerned pt mental status has changed; pt on depression med but pt's family noticing signs of dementia, cannot do simple task such as starting her car; can't remember to turn key, pt got lost when going shopping; Brandy Wyatt wants to take next step. Brandy Wyatt is not on DPR.  Brandy Wyatt wants note to sent to Dr Dayton Martes; pt does not want to schedule appt to come to doctor.Please advise. Brandy Wyatt will have his sister Johny Drilling call also.

## 2013-05-15 NOTE — Telephone Encounter (Signed)
I'm sorry to hear this but first of all, she has to sign a medical release for [place] to be able to talk to him. Also, I haven't seen her in over a year and I would definitely need to assess in an office visit her for changes like this.  I would advise him to encourage her to make an office visit.

## 2013-05-15 NOTE — Telephone Encounter (Signed)
Chan's phone number is: [phone]

## 2013-05-15 NOTE — Telephone Encounter (Signed)
I can also talk with them via telephone.

## 2013-05-15 NOTE — Telephone Encounter (Signed)
Brandy Wyatt (daughter) says that a phone call would be great.  [name] listed on DPR but her brother is not, she is aware.

## 2013-05-16 ENCOUNTER — Other Ambulatory Visit: Payer: Self-pay | Admitting: Family Medicine

## 2013-05-16 ENCOUNTER — Ambulatory Visit: Payer: Medicare Other | Admitting: Family Medicine

## 2013-05-16 DIAGNOSIS — R413 Other amnesia: Secondary | ICD-10-CM

## 2013-05-16 NOTE — Telephone Encounter (Signed)
Returned call and spoke with daughter, Johny Drilling.  She will be with her at appointment on Monday.

## 2013-05-21 ENCOUNTER — Telehealth: Payer: Self-pay

## 2013-05-21 ENCOUNTER — Encounter: Payer: Self-pay | Admitting: Family Medicine

## 2013-05-21 ENCOUNTER — Ambulatory Visit (INDEPENDENT_AMBULATORY_CARE_PROVIDER_SITE_OTHER): Payer: Medicare Other | Admitting: Family Medicine

## 2013-05-21 VITALS — BP 136/80 | HR 76 | Temp 98.2°F | Ht 67.5 in | Wt 196.8 lb

## 2013-05-21 DIAGNOSIS — R413 Other amnesia: Secondary | ICD-10-CM | POA: Diagnosis not present

## 2013-05-21 DIAGNOSIS — Z1231 Encounter for screening mammogram for malignant neoplasm of breast: Secondary | ICD-10-CM

## 2013-05-21 DIAGNOSIS — Z23 Encounter for immunization: Secondary | ICD-10-CM | POA: Diagnosis not present

## 2013-05-21 LAB — COMPREHENSIVE METABOLIC PANEL
ALT: 15 U/L (ref 0–35)
AST: 19 U/L (ref 0–37)
Albumin: 4.2 g/dL (ref 3.5–5.2)
Calcium: 9.3 mg/dL (ref 8.4–10.5)
Creatinine, Ser: 0.9 mg/dL (ref 0.4–1.2)
GFR: 63.22 mL/min (ref >60.00)
Glucose, Bld: 141 mg/dL — ABNORMAL HIGH (ref 70–99)
Sodium: 142 mEq/L (ref 135–145)

## 2013-05-21 LAB — CBC WITH DIFFERENTIAL/PLATELET
Basophils Absolute: 0 10*3/uL (ref 0.0–0.1)
Eosinophils Absolute: 0.1 10*3/uL (ref 0.0–0.7)
Hemoglobin: 15.1 g/dL — ABNORMAL HIGH (ref 12.0–15.0)
Lymphocytes Relative: 24.6 % (ref 12.0–46.0)
MCHC: 34.6 g/dL (ref 30.0–36.0)
Monocytes Relative: 7.6 % (ref 3.0–12.0)
Neutrophils Relative %: 65 % (ref 43.0–77.0)
Platelets: 225 10*3/uL (ref 150.0–400.0)
RDW: 13.9 % (ref 11.5–14.6)

## 2013-05-21 LAB — VITAMIN B12: Vitamin B-12: 495 pg/mL (ref 211–911)

## 2013-05-21 MED ORDER — CITALOPRAM HYDROBROMIDE 10 MG PO TABS: ORAL_TABLET | ORAL | Status: DC

## 2013-05-21 NOTE — Progress Notes (Signed)
Subjective:    Patient ID: Brandy Wyatt, female    DOB: 06/15/1946, 67 y.o.   MRN: [id number]  HPI  Very pleasant 67 yo female whom I have not seen since 03/2012 here with daughter to discuss memory loss.  I saw her for this complaint in 03/2012.  MMSE was 30/30 at that time.  I felt this was more pseudodementia due to depression- started her on celexa 10 mg daily and referred her to psychotherapy.    Her daughter called last week stating that she and her brother are very concerned about her memory loss recently.  Daughter reported over the phone that it improved with celexa but she stopped taking it months ago.  She never made appt with Dr. Laymond Purser. She feels she does not need psychotherapy.  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  Making her feel very anxious and overwhelmed but she wanted to do this to "give her more pupose. Does admit to feeling depressed and anxious at times.  No SI or HI.  Family is concerned because she seems to repeat herself more lately.  Has never forgotten names of family or friends or how to get home.    Patient Active Problem List   Diagnosis Date Noted  . Memory loss 05/16/2013  . Depression 04/06/2012  . HYPERLIPIDEMIA 08/26/2010  . 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 BREAST CANCER, HX OF 05/01/2010   Past Medical History  Diagnosis Date  . S/P lumpectomy of breast 2005    hx of breast cancer   Past Surgical History  Procedure Laterality Date  . Breast lumpectomy     History  Substance Use Topics  . Smoking status: Never Smoker   . Smokeless tobacco: Not on file  . Alcohol Use: No   Family History  Problem Relation Age of Onset  . Cancer Father 72    colon   No Known Allergies Current Outpatient Prescriptions on File Prior to Visit  Medication Sig Dispense Refill  . aspirin 81 MG tablet Take 81 mg by mouth daily.        .  Cholecalciferol (VITAMIN D3) 1000 UNITS CAPS Take 1 capsule by mouth daily.        . citalopram (CELEXA) 10 MG tablet Take one tablet by mouth daily  30 tablet  3  . Docosahexaenoic Acid (DHA COMPLETE PO) Take by mouth.      . Ferrous Sulfate (RA IRON) 27 MG TABS Take 1 tablet by mouth daily.        . fluticasone (FLONASE) 50 MCG/ACT nasal spray Place 2 sprays into the nose daily.  16 g  1  . folic acid (FOLVITE) 800 MCG tablet Take 800 mcg by mouth daily.        . Omega-3 Fatty Acids (FISH OIL) 1000 MG CAPS Take 1 capsule by mouth daily.        Marland Kitchen omeprazole (PRILOSEC) 40 MG capsule Take 1 capsule (40 mg total) by mouth daily.  30 capsule  5  . Red Yeast Rice 600 MG TABS Take 1 tablet by mouth daily.        . vitamin E 400 UNIT capsule Take 400 Units by mouth daily.         No current facility-administered medications on file prior to visit.   The PMH, PSH, Social History, Family History, Medications, and allergies have been reviewed in [facility], and  have been updated if relevant.    Review of Systems    See HPI Appetite decreased Wt Readings from Last 3 Encounters:  05/21/13 196 lb 12 oz (89.245 kg)  09/12/12 199 lb 12 oz (90.606 kg)  07/25/12 200 lb 4 oz (90.833 kg)  Sleeping ok  Objective:   Physical Exam BP 136/80  Pulse 76  Temp(Src) 98.2 F (36.8 C) (Oral)  Ht 5' 7.5" (1.715 m)  Wt 196 lb 12 oz (89.245 kg)  BMI 30.34 kg/m2 Gen:  Alert, pleasant, tearful Psych:  Good eye contact and thought organization.  Oriented x 3, did repeat herself once during conversation but was appropriate. Tearful but appropriate.    Assessment & Plan:   Memory loss >25 min spent with face to face with patient, >50% counseling and/or coordinating care Most consistent with pseudodementia. Agrees to restart celexa. Will also get lab work.  If symptoms persist or worsen, refer to neurology. The patient indicates understanding of these issues and agrees with the plan. Orders Placed This Encounter   Procedures  . Vitamin B12  . TSH  . Comprehensive metabolic panel  . CBC with Differential

## 2013-05-21 NOTE — Telephone Encounter (Signed)
Brandy Wyatt pts daughter left v/m; pt was in office today and forgot to ask if needs referral for diag mammogram; pt had hx of breast CA and last mammogram in 2012.Brandy Wyatt request cb.

## 2013-05-21 NOTE — Patient Instructions (Signed)
Good to see you. We are restarting Celexa 10 mg daily.  You may increase dose to 20 mg daily after 1 week if you feel it is helping.  We will call you with your lab results.  Please update me in a few weeks.

## 2013-05-21 NOTE — Addendum Note (Signed)
Addended by: Sydell Axon C on: 05/21/2013 02:19 PM   Modules accepted: Orders

## 2013-05-22 NOTE — Telephone Encounter (Signed)
She should just need screening, not diagnostic mammo right Shirlee Limerick?

## 2013-05-23 ENCOUNTER — Encounter: Payer: Self-pay | Admitting: Family Medicine

## 2013-05-23 ENCOUNTER — Other Ambulatory Visit: Payer: Self-pay

## 2013-05-23 DIAGNOSIS — Z1231 Encounter for screening mammogram for malignant neoplasm of breast: Secondary | ICD-10-CM

## 2013-05-23 NOTE — Telephone Encounter (Signed)
Order placed for screening mammogram.  Yes, given her history, I would do yearly mammograms although she has a choice to have them every other year.

## 2013-05-23 NOTE — Telephone Encounter (Signed)
Called the breast ctr to confirm that she is only due for a screening mammogram! Also the original message was that the patient was asking if she still needed to continue having yearly mammograms, she thought that she didn't have to have them anymore. Pls advise

## 2013-06-13 ENCOUNTER — Ambulatory Visit: Payer: Medicare Other | Admitting: Psychology

## 2013-06-19 ENCOUNTER — Ambulatory Visit
Admission: RE | Admit: 2013-06-19 | Discharge: 2013-06-19 | Disposition: A | Discharge status: Home / Self Care | Payer: Medicare Other | Source: Ambulatory Visit

## 2013-06-19 DIAGNOSIS — Z1231 Encounter for screening mammogram for malignant neoplasm of breast: Secondary | ICD-10-CM | POA: Diagnosis not present

## 2013-06-19 DIAGNOSIS — Z853 Personal history of malignant neoplasm of breast: Secondary | ICD-10-CM | POA: Diagnosis not present

## 2013-06-26 ENCOUNTER — Ambulatory Visit: Payer: Medicare Other | Admitting: Neurology

## 2013-09-07 ENCOUNTER — Other Ambulatory Visit: Payer: Self-pay | Admitting: Family Medicine

## 2013-10-06 ENCOUNTER — Other Ambulatory Visit: Payer: Self-pay | Admitting: Family Medicine

## 2013-10-22 ENCOUNTER — Telehealth: Payer: Self-pay

## 2013-10-22 MED ORDER — CITALOPRAM HYDROBROMIDE 20 MG PO TABS: 20.0000 mg | ORAL_TABLET | Freq: Every day | ORAL | Status: DC

## 2013-10-22 NOTE — Telephone Encounter (Signed)
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 Brandy Wyatt said pt is taking citalopram 10 mg daily, pt never increased to 20 mg; pt seen 05/21/2013. No SI/HI. Chan request cb if med should be increased or what to do.Please advise.Armed forces technical officer.

## 2013-10-22 NOTE — Telephone Encounter (Signed)
Spoke to [place] and informed her Rx has been sent to requested pharmacy

## 2013-10-22 NOTE — Telephone Encounter (Signed)
Yes it is definitely ok to try increased dose of citalopram- increase to 20 mg daily- ok to take two of 10 mg tablets daily.  We can also send in rx for citalopram 20 mg daily- #30 with 1 refill.

## 2013-10-22 NOTE — Telephone Encounter (Signed)
Lm on Chan's vm advising her Dr Deborra Medina. New Rx sent to requested pharmacy

## 2013-10-22 NOTE — Telephone Encounter (Signed)
Chan left v/m requesting cb.

## 2014-01-01 ENCOUNTER — Other Ambulatory Visit: Payer: Self-pay | Admitting: Family Medicine

## 2014-01-07 ENCOUNTER — Ambulatory Visit (INDEPENDENT_AMBULATORY_CARE_PROVIDER_SITE_OTHER): Payer: Medicare Other | Admitting: Neurology

## 2014-01-07 ENCOUNTER — Encounter: Payer: Self-pay | Admitting: Neurology

## 2014-01-07 VITALS — BP 130/70 | HR 70 | Temp 98.0°F | Resp 18 | Ht 68.0 in | Wt 200.9 lb

## 2014-01-07 DIAGNOSIS — R413 Other amnesia: Secondary | ICD-10-CM | POA: Diagnosis not present

## 2014-01-07 NOTE — Patient Instructions (Addendum)
1.  We will get MRI of the brain [facility] 01/18/14 @ 10:45 am  2.  We will recheck vitamin b12 and thyroid levels 3.  I will refer you to neuropscyhological testing to really evaluate the different areas of your cognition 4.  Follow up after testing.

## 2014-01-07 NOTE — Progress Notes (Signed)
NEUROLOGY CONSULTATION NOTE  SAMATHA ANSPACH MRN: [id number] DOB: Mar 08, 1946  Referring provider: Dr. Deborra Medina Primary care provider: Dr. Deborra Medina  Reason for consult:  Memory problems.  HISTORY OF PRESENT ILLNESS: Brandy Wyatt is a 68 year old right-handed woman with history of hyperlipidemia, breast cancer status post lumpectomy (2005), and depression who presents for memory problems.  [name] accompanied by her son and daughter. Records personally reviewed.  She began having memory problems approximately 3 years ago. At first she would forget conversations and repeats questions. This progressed to where she would easily forget errands that needed to be done. She began writing post it's to help remind her, but then often she would forget that she wrote something down. She has not had any problems with recognizing faces or recalling names of acquaintances or friends or family. She has had some difficulty with certain tasks. For example, her son said she came back and says she was not able to open up the P.O. Box at the post office. This is a P.O. Box that has been used for many years and has a specific type of daily. Therefore, it was determined that she either was trying to open the wrong box or was not turning the key correctly. [name] also had difficulty in the past cranking the car.  [name] able to perform all her activities of daily living and 10 to the house. She does all the cleaning and grocery shopping. Whenever she goes grocery shopping, she has a list, otherwise she would either by 2 many groceries or forget to by certain items. She manages all the finances of the home. She feels like that she does get frustrated pain the bills, such as trying to remember which bank account she shouldn't use or if she has enough money to pay the bills. However she has not missed any payments. She still drives. However, she has felt a little disoriented even on familiar routes. For example, she had difficulty  remembering how to drive to a Therapist, art.  One time when she was driving her grandchildren to the park, he stopped through [employer] for food. She had difficulty getting back on route.  She denies hallucinations or delusions.  She lives in a house with her husband.  She does feel fatigued.  She does have a history of depression but she feels it is under fairly good control.  She has been under some stress in that her mother-in-law passed away last 06-27-23.  Also, her husband may be a little difficult to deal with.  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  She says her mother had history of dementia.  05/21/13 LABS:  B12 495, TSH 2.16.  Other than glucose of 141, CBC and CMP unremarkable.  PAST MEDICAL HISTORY: Past Medical History  Diagnosis Date  . S/P lumpectomy of breast 2005    hx of breast cancer  . Cancer     PAST SURGICAL HISTORY: Past Surgical History  Procedure Laterality Date  . Breast lumpectomy      MEDICATIONS: Current Outpatient Prescriptions on File Prior to Visit  Medication Sig Dispense Refill  . Omega-3 Fatty Acids (FISH OIL) 1000 MG CAPS Take 1 capsule by mouth daily.        Marland Kitchen aspirin 81 MG tablet Take 81 mg by mouth daily.        . Cholecalciferol (VITAMIN D3) 1000 UNITS CAPS Take 1 capsule by mouth daily.        Marland Kitchen  citalopram (CELEXA) 20 MG tablet TAKE 1 TABLET BY MOUTH EVERY DAY  30 tablet  0  . Docosahexaenoic Acid (DHA COMPLETE PO) Take by mouth.      . Ferrous Sulfate (RA IRON) 27 MG TABS Take 1 tablet by mouth daily.        . fluticasone (FLONASE) 50 MCG/ACT nasal spray Place 2 sprays into the nose daily.  16 g  1  . folic acid (FOLVITE) 643 MCG tablet Take 800 mcg by mouth daily.        . NON FORMULARY Procera AVH for memory- Take 3 by mouth daily.      . Omega-3 Fatty Acids (OMEGA 3 PO) Take by mouth daily.      Marland Kitchen omeprazole (PRILOSEC) 40 MG capsule Take 1 capsule (40 mg total) by mouth  daily.  30 capsule  5  . Red Yeast Rice 600 MG TABS Take 2 tablets by mouth daily.       . vitamin E 1000 UNIT capsule Take 1,000 Units by mouth daily.      . vitamin E 400 UNIT capsule Take 400 Units by mouth daily.         No current facility-administered medications on file prior to visit.    ALLERGIES: No Known Allergies  FAMILY HISTORY: Family History  Problem Relation Age of Onset  . Cancer Father 15    colon  . Diabetes Mother   . Stroke Mother   . Thyroid disease Mother   . Diabetes Brother     SOCIAL HISTORY: History   Social History  . Marital Status: Married    Spouse Name: N/A    Number of Children: N/A  . Years of Education: N/A   Occupational History  . Not on file.   Social History Main Topics  . Smoking status: Never Smoker   . Smokeless tobacco: Never Used  . Alcohol Use: No  . Drug Use: No  . Sexual Activity: Yes    Partners: Male   Other Topics Concern  . Not on file   Social History Narrative  . No narrative on file    REVIEW OF SYSTEMS: Constitutional: No fevers, chills, or sweats, no generalized fatigue, change in appetite Eyes: No visual changes, double vision, eye pain Ear, nose and throat: No hearing loss, ear pain, nasal congestion, sore throat Cardiovascular: No chest pain, palpitations Respiratory:  No shortness of breath at rest or with exertion, wheezes GastrointestinaI: No nausea, vomiting, diarrhea, abdominal pain, fecal incontinence Genitourinary:  No dysuria, urinary retention or frequency Musculoskeletal:  No neck pain, back pain Integumentary: No rash, pruritus, skin lesions Neurological: as above Psychiatric: No depression, insomnia, anxiety Endocrine: No palpitations, fatigue, diaphoresis, mood swings, change in appetite, change in weight, increased thirst Hematologic/Lymphatic:  No anemia, purpura, petechiae. Allergic/Immunologic: no itchy/runny eyes, nasal congestion, recent allergic reactions, rashes  PHYSICAL  EXAM: Filed Vitals:   01/07/14 1033  BP: 130/70  Pulse: 70  Temp: 98 F (36.7 C)  Resp: 18   General: No acute distress Head:  Normocephalic/atraumatic Neck: supple, no paraspinal tenderness, full range of motion Back: No paraspinal tenderness Heart: regular rate and rhythm Lungs: Clear to auscultation bilaterally. Vascular: No carotid bruits. Neurological Exam: Mental status: alert and oriented to person, place, and time (except she said it was May 1st), unable to recall all 5 words after 5 minutes, remote memory intact, fund of knowledge intact, attention and concentration somewhat impaired (unable to repeat set of 3 numbers in the reverse  order correctly and had difficulty with serial 7 subtraction), visuospatial and excecutive functioning impaired (incorrectly drew Trail Making Test, unable to correctly copy of cube, able to draw a clock and placement of numbers correctly but unable to draw the hands to requested time), abstraction somewhat limited as she was unable to correctly say similarity between one of two sets of words, speech fluent and not dysarthric, naming and repetition intact but naming fluency poor.  MOCA 16/30 Cranial nerves: CN I: not tested CN II: pupils equal, round and reactive to light, visual fields intact, fundi unremarkable, without vessel changes, exudates, hemorrhages or papilledema. CN III, IV, VI:  full range of motion, no nystagmus, no ptosis CN V: facial sensation intact CN VII: upper and lower face symmetric CN VIII: hearing intact CN IX, X: gag intact, uvula midline CN XI: sternocleidomastoid and trapezius muscles intact CN XII: tongue midline Bulk & Tone: normal, no fasciculations. Motor: 5 out of 5 throughout Sensation: Temperature and vibration intact Deep Tendon Reflexes: 2+ throughout except absent in the ankles, toes downgoing Finger to nose testing: No dysmetria Heel to shin: No dysmetria Gait: Normal station and stride. Able to turn and  walk in tandem. Romberg negative.  IMPRESSION: Memory complaints with deficiencies in multiple domains on MOCA.  I suspect that depression or inattention/inability to focus would likely be playing a part as the Canby score appears more severe than her actual functioning.  PLAN: 1.  Will refer for neuropsych testing 2.  Will get MRI of the brain without contrast. 3.  Will recheck B12 and TSH. 4.  Follow up after testing.  45 minutes spent with patient, over 50% spent counseling and coordinating care.  Thank you for allowing me to take part in the care of this patient.  Metta Clines, DO  CC:  Arnette Norris, MD

## 2014-01-08 ENCOUNTER — Telehealth: Payer: Self-pay | Admitting: *Deleted

## 2014-01-08 LAB — VITAMIN B12: VITAMIN B 12: 932 pg/mL — AB (ref 211–911)

## 2014-01-08 LAB — TSH: TSH: 2.627 u[IU]/mL (ref 0.350–4.500)

## 2014-01-08 NOTE — Telephone Encounter (Signed)
Error

## 2014-01-08 NOTE — Telephone Encounter (Signed)
Message copied by Claudie Revering on Tue Jan 08, 2014  8:20 AM ------      Message from: JAFFE, ADAM R      Created: Tue Jan 08, 2014  5:29 AM       Labs look okay      ----- Message -----         From: [facility] in [facility] Interface         Sent: 01/08/2014   2:12 AM           To: Dudley Major, DO                   ------

## 2014-01-08 NOTE — Telephone Encounter (Signed)
Patient is aware of labs °

## 2014-01-18 ENCOUNTER — Ambulatory Visit (HOSPITAL_COMMUNITY)
Admission: RE | Admit: 2014-01-18 | Discharge: 2014-01-18 | Disposition: A | Discharge status: Home / Self Care | Payer: Medicare Other | Source: Ambulatory Visit | Attending: Neurology | Admitting: Neurology

## 2014-01-18 DIAGNOSIS — R413 Other amnesia: Secondary | ICD-10-CM | POA: Diagnosis not present

## 2014-01-18 DIAGNOSIS — I609 Nontraumatic subarachnoid hemorrhage, unspecified: Secondary | ICD-10-CM | POA: Diagnosis not present

## 2014-01-21 ENCOUNTER — Telehealth: Payer: Self-pay | Admitting: Neurology

## 2014-01-21 NOTE — Telephone Encounter (Signed)
Discussed MRI results with daughter.  Nothing acute.  There did seem to be evidence of possible remote subarachnoid blood.  No known trauma however.  She has upcoming appointment for neuropsych testing.  Will follow up soon after.

## 2014-01-28 DIAGNOSIS — R413 Other amnesia: Secondary | ICD-10-CM | POA: Diagnosis not present

## 2014-02-01 ENCOUNTER — Other Ambulatory Visit: Payer: Self-pay | Admitting: Family Medicine

## 2014-02-02 ENCOUNTER — Other Ambulatory Visit: Payer: Self-pay | Admitting: Family Medicine

## 2014-02-12 DIAGNOSIS — F0280 Dementia in other diseases classified elsewhere without behavioral disturbance: Secondary | ICD-10-CM | POA: Diagnosis not present

## 2014-02-12 DIAGNOSIS — G309 Alzheimer's disease, unspecified: Secondary | ICD-10-CM | POA: Diagnosis not present

## 2014-02-22 ENCOUNTER — Telehealth: Payer: Self-pay | Admitting: Neurology

## 2014-02-22 ENCOUNTER — Encounter: Payer: Self-pay | Admitting: Neurology

## 2014-02-22 ENCOUNTER — Ambulatory Visit (INDEPENDENT_AMBULATORY_CARE_PROVIDER_SITE_OTHER): Payer: Medicare Other | Admitting: Neurology

## 2014-02-22 VITALS — BP 116/74 | HR 70 | Resp 16 | Ht 68.0 in | Wt 198.0 lb

## 2014-02-22 DIAGNOSIS — I609 Nontraumatic subarachnoid hemorrhage, unspecified: Secondary | ICD-10-CM | POA: Insufficient documentation

## 2014-02-22 DIAGNOSIS — G309 Alzheimer's disease, unspecified: Principal | ICD-10-CM

## 2014-02-22 DIAGNOSIS — F0280 Dementia in other diseases classified elsewhere without behavioral disturbance: Secondary | ICD-10-CM | POA: Diagnosis not present

## 2014-02-22 MED ORDER — DONEPEZIL HCL 5 MG PO TABS: 5.0000 mg | ORAL_TABLET | Freq: Every day | ORAL | Status: DC

## 2014-02-22 NOTE — Telephone Encounter (Signed)
Pt's daughter called stating that Medicare part D is going to take a while to be effective and was Wondering if her mom can get samples of  ARICEPT 5mg   C/B [phone]

## 2014-02-22 NOTE — Telephone Encounter (Signed)
Please advise on below note 

## 2014-02-22 NOTE — Progress Notes (Signed)
NEUROLOGY FOLLOW UP OFFICE NOTE  Brandy Wyatt [id number]  HISTORY OF PRESENT ILLNESS: Brandy Wyatt is a 68 year old right-handed woman with history of hyperlipidemia, breast cancer status post lumpectomy (2005), and depression who follows up for memory problems.  [name] accompanied by her son and daughter. Records personally reviewed.  UPDATE: She underwent formal neuropsychological testing on 01/28/14.  Testing revealed impairment in new learning and memory, language and executive functioning.  Findings are most consistent with mild to moderate Alzheimer's disease.  01/18/14 MRI BRAIN WO:  Mild diffuse cerebral atrophy, mild chronic small vessel ischemic changes.  Mild chronic bifrontal subarachnoid hemorrhage 01/07/14 LABS:  B12 932, TSH 2.627  HISTORY: She began having memory problems approximately 3 years ago. At first she would forget conversations and repeats questions. This progressed to where she would easily forget errands that needed to be done. She began writing post it's to help remind her, but then often she would forget that she wrote something down. She has not had any problems with recognizing faces or recalling names of acquaintances or friends or family. She has had some difficulty with certain tasks. For example, her son said she came back and says she was not able to open up the P.O. Box at the post office. This is a P.O. Box that has been used for many years and has a specific type of daily. Therefore, it was determined that she either was trying to open the wrong box or was not turning the key correctly. [name] also had difficulty in the past cranking the car.  [name] able to perform all her activities of daily living and 10 to the house. She does all the cleaning and grocery shopping. Whenever she goes grocery shopping, she has a list, otherwise she would either by 2 many groceries or forget to by certain items. She manages all the finances of the home. She feels like that she  does get frustrated pain the bills, such as trying to remember which bank account she shouldn't use or if she has enough money to pay the bills. However she has not missed any payments. She still drives. However, she has felt a little disoriented even on familiar routes. For example, she had difficulty remembering how to drive to a Therapist, art.  One time when she was driving her grandchildren to the park, he stopped through [employer] for food. She had difficulty getting back on route.  She denies hallucinations or delusions.  She lives in a house with her husband.  She does feel fatigued.  She does have a history of depression but she feels it is under fairly good control.  She has been under some stress in that her mother-in-law passed away last 2023-07-09.  Also, her husband may be a little difficult to deal with.  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  She says her mother had history of dementia.  MoCA score from 01/07/14 was 16/30.  PAST MEDICAL HISTORY: Past Medical History  Diagnosis Date  . S/P lumpectomy of breast 2005    hx of breast cancer  . Cancer     MEDICATIONS: Current Outpatient Prescriptions on File Prior to Visit  Medication Sig Dispense Refill  . aspirin 81 MG tablet Take 81 mg by mouth daily.        . Cholecalciferol (VITAMIN D3) 1000 UNITS CAPS Take 1 capsule by mouth daily.        Marland Kitchen  citalopram (CELEXA) 10 MG tablet TAKE 1 TABLET BY MOUTH DAILY  30 tablet  0  . citalopram (CELEXA) 20 MG tablet TAKE 1 TABLET BY MOUTH EVERY DAY  30 tablet  0  . Docosahexaenoic Acid (DHA COMPLETE PO) Take by mouth.      . Ferrous Sulfate (RA IRON) 27 MG TABS Take 1 tablet by mouth daily.        . fluticasone (FLONASE) 50 MCG/ACT nasal spray Place 2 sprays into the nose daily.  16 g  1  . folic acid (FOLVITE) 376 MCG tablet Take 800 mcg by mouth daily.        . NON FORMULARY Procera AVH for memory- Take 3 by mouth daily.      .  Omega-3 Fatty Acids (FISH OIL) 1000 MG CAPS Take 1 capsule by mouth daily.        . Omega-3 Fatty Acids (OMEGA 3 PO) Take by mouth daily.      Marland Kitchen omeprazole (PRILOSEC) 40 MG capsule Take 1 capsule (40 mg total) by mouth daily.  30 capsule  5  . Red Yeast Rice 600 MG TABS Take 2 tablets by mouth daily.       . vitamin E 1000 UNIT capsule Take 1,000 Units by mouth daily.      . vitamin E 400 UNIT capsule Take 400 Units by mouth daily.         No current facility-administered medications on file prior to visit.    ALLERGIES: No Known Allergies  FAMILY HISTORY: Family History  Problem Relation Age of Onset  . Cancer Father 9    colon  . Diabetes Mother   . Stroke Mother   . Thyroid disease Mother   . Diabetes Brother     SOCIAL HISTORY: History   Social History  . Marital Status: Married    Spouse Name: N/A    Number of Children: N/A  . Years of Education: N/A   Occupational History  . Not on file.   Social History Main Topics  . Smoking status: Never Smoker   . Smokeless tobacco: Never Used  . Alcohol Use: No  . Drug Use: No  . Sexual Activity: Yes    Partners: Male   Other Topics Concern  . Not on file   Social History Narrative  . No narrative on file    REVIEW OF SYSTEMS: Constitutional: No fevers, chills, or sweats, no generalized fatigue, change in appetite Eyes: No visual changes, double vision, eye pain Ear, nose and throat: No hearing loss, ear pain, nasal congestion, sore throat Cardiovascular: No chest pain, palpitations Respiratory:  No shortness of breath at rest or with exertion, wheezes GastrointestinaI: No nausea, vomiting, diarrhea, abdominal pain, fecal incontinence Genitourinary:  No dysuria, urinary retention or frequency Musculoskeletal:  No neck pain, back pain Integumentary: No rash, pruritus, skin lesions Neurological: as above Psychiatric: No depression, insomnia, anxiety Endocrine: No palpitations, fatigue, diaphoresis, mood swings,  change in appetite, change in weight, increased thirst Hematologic/Lymphatic:  No anemia, purpura, petechiae. Allergic/Immunologic: no itchy/runny eyes, nasal congestion, recent allergic reactions, rashes  PHYSICAL EXAM: Filed Vitals:   02/22/14 1023  BP: 116/74  Pulse: 70  Resp: 16   General: No acute distress Head:  Normocephalic/atraumatic Neck: supple, no paraspinal tenderness, full range of motion Heart:  Regular rate and rhythm Lungs:  Clear to auscultation bilaterally Back: No paraspinal tenderness Neurological Exam: alert and oriented to person, place, and time. Attention span and concentration intact, recent and remote  memory intact, fund of knowledge intact.  Speech fluent and not dysarthric, language intact.  CN II-XII intact. Fundoscopic exam unremarkable without vessel changes, exudates, hemorrhages or papilledema.  Bulk and tone normal, muscle strength 5/5 throughout.  Sensation to light touch, temperature and vibration intact.  Deep tendon reflexes 2+ throughout, toes downgoing.  Finger to nose and heel to shin testing intact.  Gait normal, Romberg negative.  IMPRESSION: Alzheimer's disease Depression Chronic subarachnoid hemorrhage.  No known history of head trauma.  PLAN: 1.  Initiate Aricept 5mg  at bedtime for 4 weeks.  If tolerating, then would increase to 10mg  at bedtime 2.  Continue citalopram 20mg  daily for now.  Once she appears stable on Aricept, then consider increase to 40mg . 3.  MRA of the head to assess for vascular cause of subarachnoid hemorrhage, such as intracranial aneurysm. 4.  Advised daughter to sit with patient while she drives to assess safety.  Advised to avoid night driving. 5.  Repeat neuropsych testing in 9 months.  30 minutes spent with the patient and her daughter, 100% spent discussing diagnosis and coordinating plan.  Metta Clines, DO  CC:  Arnette Norris, MD

## 2014-02-22 NOTE — Patient Instructions (Signed)
1.  We will start donepezil (Aricept) 5mg  daily for four weeks.  If you are tolerating the medication, then after four weeks, we will increase the dose to 10mg  daily.  Side effects include nausea, vomiting, diarrhea, vivid dreams, and muscle cramps.  Please call the clinic if you experience any of these symptoms. 2.  We will get MRA of the head to look for any evidence of aneurysms 3.  Limit driving locally 4.  Follow up in 5 months.

## 2014-02-25 ENCOUNTER — Ambulatory Visit: Payer: Medicare Other | Admitting: Neurology

## 2014-02-25 NOTE — Telephone Encounter (Signed)
We don't have samples.

## 2014-02-25 NOTE — Telephone Encounter (Signed)
Returned call to patient's daughter/Chan and notified.

## 2014-03-01 ENCOUNTER — Telehealth: Payer: Self-pay | Admitting: Neurology

## 2014-03-01 NOTE — Telephone Encounter (Signed)
Pt daughter called and said that the medication is making her sick please call [phone]

## 2014-03-04 NOTE — Telephone Encounter (Signed)
Left msg on voicemail to return my call.

## 2014-03-05 MED ORDER — RIVASTIGMINE 4.6 MG/24HR TD PT24: MEDICATED_PATCH | TRANSDERMAL | Status: DC

## 2014-03-05 NOTE — Telephone Encounter (Signed)
New Rx sent to patient's pharmacy. I called and left detailed msg on daughter Chan's voicemail on new medication with instructions. Asked her to return my call if any questions.

## 2014-03-05 NOTE — Telephone Encounter (Signed)
Spoke with patient's daughter/Chan. She states since patient has started the Aricept she gets upset stomach, nausea, & loose stools about every other day. She hasn't had any vomiting. Please Advise.

## 2014-03-05 NOTE — Telephone Encounter (Signed)
I would stop the Aricept.  Instead, we can try Exelon patch.  I would start 4.6mg .  It should be applied to the skin and replaced every 24 hours.  I would give her a month supply.  If tolerating, we can increase the dose to 9.5mg .

## 2014-03-08 ENCOUNTER — Ambulatory Visit (HOSPITAL_COMMUNITY): Admission: RE | Admit: 2014-03-08 | Payer: Medicare Other | Source: Ambulatory Visit

## 2014-03-12 ENCOUNTER — Telehealth: Payer: Self-pay | Admitting: Neurology

## 2014-03-12 MED ORDER — ONDANSETRON HCL 4 MG PO TABS: 4.0000 mg | ORAL_TABLET | Freq: Every day | ORAL | Status: DC | PRN

## 2014-03-12 NOTE — Telephone Encounter (Signed)
Called patient's daughter/Chan and notified.

## 2014-03-12 NOTE — Telephone Encounter (Signed)
Pt's daughter called requesting to speak to a nurse regarding pt's meds. C/B [phone]

## 2014-03-12 NOTE — Telephone Encounter (Signed)
Understandable.  I sent rx for zofran, so they can try this with Aricept.    Donika K. Posey Pronto, DO

## 2014-03-12 NOTE — Telephone Encounter (Signed)
Patient was switched to Exelon patch last week due to Aricept causing GI upset(see previous phone call). Daughter called back today and stated that right now patient doesn't have rx plan with insurance, patient will not have rx plan coverage until October. Patch out of pocket is $500.00. She would like for her to try rx for zofran to help with issues until she can get rx coverage to get the patch.

## 2014-03-19 ENCOUNTER — Telehealth: Payer: Self-pay | Admitting: *Deleted

## 2014-03-19 ENCOUNTER — Ambulatory Visit (HOSPITAL_COMMUNITY)
Admission: RE | Admit: 2014-03-19 | Discharge: 2014-03-19 | Disposition: A | Discharge status: Home / Self Care | Payer: Medicare Other | Source: Ambulatory Visit | Attending: Neurology | Admitting: Neurology

## 2014-03-19 DIAGNOSIS — I6789 Other cerebrovascular disease: Secondary | ICD-10-CM | POA: Diagnosis not present

## 2014-03-19 DIAGNOSIS — I609 Nontraumatic subarachnoid hemorrhage, unspecified: Secondary | ICD-10-CM | POA: Diagnosis not present

## 2014-03-19 DIAGNOSIS — R51 Headache: Secondary | ICD-10-CM | POA: Diagnosis not present

## 2014-03-19 DIAGNOSIS — I672 Cerebral atherosclerosis: Secondary | ICD-10-CM | POA: Insufficient documentation

## 2014-03-19 DIAGNOSIS — R413 Other amnesia: Secondary | ICD-10-CM | POA: Insufficient documentation

## 2014-03-19 NOTE — Telephone Encounter (Signed)
Left message for patient to contact office for MRA MR results

## 2014-03-20 ENCOUNTER — Ambulatory Visit: Payer: Medicare Other | Admitting: Family Medicine

## 2014-03-20 DIAGNOSIS — Z0289 Encounter for other administrative examinations: Secondary | ICD-10-CM

## 2014-07-24 ENCOUNTER — Telehealth: Payer: Self-pay | Admitting: Neurology

## 2014-07-24 NOTE — Telephone Encounter (Signed)
Pt called and canceled appt 07-25-14 and did not resch appt H[facility] Dyer

## 2014-07-26 ENCOUNTER — Ambulatory Visit: Payer: Medicare Other | Admitting: Neurology

## 2014-09-17 ENCOUNTER — Encounter: Payer: Self-pay | Admitting: Internal Medicine

## 2014-09-17 ENCOUNTER — Ambulatory Visit (INDEPENDENT_AMBULATORY_CARE_PROVIDER_SITE_OTHER): Payer: Medicare Other | Admitting: Internal Medicine

## 2014-09-17 VITALS — BP 118/84 | HR 74 | Temp 98.1°F | Wt 207.0 lb

## 2014-09-17 DIAGNOSIS — M720 Palmar fascial fibromatosis [Dupuytren]: Secondary | ICD-10-CM

## 2014-09-17 NOTE — Progress Notes (Signed)
Pre visit review using our clinic review tool, if applicable. No additional management support is needed unless otherwise documented below in the visit note. 

## 2014-09-17 NOTE — Patient Instructions (Signed)
Dupuytren's Contracture Dupuytren's contracture affects the fingers and the palm of the hand. This condition usually develops slowly. It may take many years to develop. The pinky finger and the ring finger are most often affected. These fingers start to curve inward, like a claw. At some point, the fingers cannot go straight anymore. This can make it hard to do things like:  Put on gloves.  Shake hands.  Grab something off a shelf. The condition usually does not cause pain and is not dangerous. The condition gets its name from the doctor who came up with an operation to fix the problem. His name was Baron Guillaume Dupuytren. Contracture means pulling inward. CAUSES  Dupuytren's contracture does not start with the fingers. It starts in the palm of the hand, under the skin. The tissue under the skin is called fascia. The fascia covers the cords (tendons) that control how the fingers move. In Dupuytren's contracture the fascia tissue becomes thick and then pulls on the cords. That causes the fingers to curl. The condition can affect both hands and any fingers, but it usually strikes one hand worse than the other. The fingers farthest from the thumb are most often the ones that curl. The cause is not clear. Some experts believe it results from an autoimmune reaction. That means the body's immune system (which fights off disease) attacks itself by mistake. What experts do know is that certain conditions and behaviors (called risk factors) make the chance of having this condition more likely. They include:  Age. Most people who have the condition are older than 50.  Sex. It affects men more often than women.  Family history. The condition tends to run in families from countries in [place].  Certain behaviors. People who smoke and drink alcohol are more apt to develop the problem.  Some other medical conditions. Having diabetes makes Dupuytren's contracture more likely. So does  having a condition that involves a seizure (when the brain's function is interrupted). SYMPTOMS  Signs of this condition take time to develop. Sometimes this takes weeks or months. More often, it takes several years.   Early symptoms:  Skin on the palm of the hand becomes thick. This is usually the first sign.  The skin may look dimpled or puckered.  Lumps (nodules) show up on the palm. There may be one or more lumps. They are not painful.  Later symptoms:  Thick cords of tissue form in the palm of the hand.  The pinky and ring fingers start to curl up into the palm.  The fingers cannot be straightened into their normal position. DIAGNOSIS  A physical examination is the main way that a healthcare provider can tell if you have Dupuytren's contracture. Other tests usually are not needed. The caregiver will probably:  Look at your hands. Feel your hands. This is to check for thickening and nodules.  Measure finger motion. This tells how much your fingers have contracted (pulled in).  Do a tabletop test. You will be asked to try to put your hand flat on a table, palm down. TREATMENT  There is no cure for Dupuytren's contracture. But there are ways to treat the symptoms. Options include:  Watching and waiting. The condition develops slowly. Often it does not create problems for a long time. Sometimes the skin gets thick and nodules form, but the fingers never curl. So, in some cases it is best to just watch the condition carefully and wait to see what happens.    Shots (injections). Different substances may be injected, including:  Steroids. These drugs block swelling. These shots should make the condition less uncomfortable. Steroids may also slow down the condition. Shots are given into the nodules. The effect only lasts awhile. More shots may have to be given.  Enzymes. These are proteins. They weaken the thick tissue. After an injection, the caregiver usually stretches the  fingers.  Needling. A needle is pushed through the skin and into the thick tissue. This is done in several spots. The goal is to break up the thickened tissue. Or to weaken it.  Surgery. This may be suggested if you cannot grasp objects. Or, if you can no longer put your hand in your pocket.  A cut (incision) is made in the palm of the hand. The thick tissue is removed.  Sometimes the thick tissue is attached to the skin. Then, the skin must be removed, too. It is replaced with a piece of skin from another place on your body. That is called a skin graft.  Occupational or hand therapy is almost always needed after surgery. This involves special exercises to get back the use of your hand and fingers. After a skin graft, several months of therapy may be needed.  Sometimes the condition comes back, even after surgery.  Other methods. You can do some things on your own. They include:  Stretching the fingers backwards. Do this often.  Warming the hand and massaging it. Again, do this often.  Using tools with padded grips. This should make things easier.  Wearing heavy gloves while working. This protects the hands. PROGNOSIS  Dupuytren's contracture usually develops slowly. There is no cure. But, the symptoms can be treated. Sometimes they come back after treatment, but not always. It is important to remember that this is a functional problem and not a life-threatening condition. Document Released: 05/23/2009 Document Revised: 10/18/2011 Document Reviewed: 05/23/2009 ExitCare Patient Information 2015 [employer]. This information is not intended to replace advice given to you by your health care provider. Make sure you discuss any questions you have with your health care provider.  

## 2014-09-17 NOTE — Progress Notes (Signed)
Subjective:     Patient ID: Brandy Wyatt, female   DOB: 03-22-1946, 69 y.o.   MRN: [id number]  HPI  [name] is a 69 y.o. female presenting for c/o tightening in the palm of both hands x 1 year. She thought it was due to aging at onset but has noticed a progression over the year. She can move all of her fingers on both hands but feels some restriction in middle, ring and pinky fingers; right hand feels tighter. Denies pain, redness or swelling. She has not tried anything OTC.  Review of Systems      Past Medical History  Diagnosis Date  . S/P lumpectomy of breast 2005    hx of breast cancer  . Cancer     Current Outpatient Prescriptions  Medication Sig Dispense Refill  . aspirin 81 MG tablet Take 81 mg by mouth daily.      . Multiple Vitamin (MULTIVITAMIN) tablet Take 1 tablet by mouth daily.    . NON FORMULARY Procera AVH for memory- Take 3 by mouth daily.    . rivastigmine (EXELON) 4.6 mg/24hr Apply 1 patch to skin every 24 hours. 30 patch 0   No current facility-administered medications for this visit.    No Known Allergies  Family History  Problem Relation Age of Onset  . Cancer Father 2    colon  . Diabetes Mother   . Stroke Mother   . Thyroid disease Mother   . Diabetes Brother     History   Social History  . Marital Status: Married    Spouse Name: N/A    Number of Children: N/A  . Years of Education: N/A   Occupational History  . Not on file.   Social History Main Topics  . Smoking status: Never Smoker   . Smokeless tobacco: Never Used  . Alcohol Use: No  . Drug Use: No  . Sexual Activity:    Partners: Male   Other Topics Concern  . Not on file   Social History Narrative    Constitutional: Denies fever, malaise, fatigue, headache or abrupt weight changes.  Respiratory: Denies difficulty breathing, shortness of breath, cough or sputum production.   Cardiovascular: Denies chest pain, chest tightness, palpitations or swelling in the hands or  feet.  Musculoskeletal: Denies decrease in range of motion, difficulty with gait, muscle pain or joint pain and swelling.  Skin: Denies redness, rashes, lesions or ulcercations.  Neurological: Denies dizziness, difficulty with memory, difficulty with speech or problems with balance and coordination.   No other specific complaints in a complete review of systems (except as listed in HPI above).  Objective:   Physical Exam   BP 118/84 mmHg  Pulse 74  Temp(Src) 98.1 F (36.7 C) (Oral)  Wt 207 lb (93.895 kg)  SpO2 97% Wt Readings from Last 3 Encounters:  09/17/14 207 lb (93.895 kg)  02/22/14 198 lb (89.812 kg)  01/07/14 200 lb 14.4 oz (91.128 kg)    General: Appears her stated age, well developed, well nourished in NAD. Skin: Warm, dry and intact. No rashes, lesions or ulcerations noted. Cardiovascular: Normal rate and rhythm. S1,S2 noted.  No murmur, rubs or gallops noted.  Pulmonary/Chest: Normal effort and positive vesicular breath sounds. No respiratory distress. No wheezes, rales or ronchi noted.  Musculoskeletal: Decreased range of motion in 3rd, 4th and 5th fingers w/ contracture, limited extension of MCP and PIP joints,    palpable cords running longitudinally b/l palm of right and left hands.  No redness, swelling or tenderness to palpation.  BMET    Component Value Date/Time   NA 142 05/21/2013 1123   K 4.0 05/21/2013 1123   CL 105 05/21/2013 1123   CO2 27 05/21/2013 1123   GLUCOSE 141* 05/21/2013 1123   BUN 11 05/21/2013 1123   CREATININE 0.9 05/21/2013 1123   CALCIUM 9.3 05/21/2013 1123    Lipid Panel     Component Value Date/Time   CHOL 220* 08/24/2010 1032   TRIG 87.0 08/24/2010 1032   HDL 51.10 08/24/2010 1032   CHOLHDL 4 08/24/2010 1032   VLDL 17.4 08/24/2010 1032    CBC    Component Value Date/Time   WBC 5.3 05/21/2013 1123   RBC 5.01 05/21/2013 1123   HGB 15.1* 05/21/2013 1123   HCT 43.6 05/21/2013 1123   PLT 225.0 05/21/2013 1123   MCV 87.1  05/21/2013 1123   MCHC 34.6 05/21/2013 1123   RDW 13.9 05/21/2013 1123   LYMPHSABS 1.3 05/21/2013 1123   MONOABS 0.4 05/21/2013 1123   EOSABS 0.1 05/21/2013 1123   BASOSABS 0.0 05/21/2013 1123    Hgb A1C No results found for: HGBA1C         Assessment:     Dupuytren's contracture of both hands     Plan:     Patient education on disorder, progression and possible future treatment options. Gave detailed patient handout.  If worse, consider steriod injection by Dr. Lorelei Pont  RTC as needed or if symptoms persist or worsen

## 2014-10-09 ENCOUNTER — Other Ambulatory Visit: Payer: Self-pay | Admitting: Neurology

## 2014-10-17 ENCOUNTER — Other Ambulatory Visit: Payer: Self-pay | Admitting: Family Medicine

## 2014-10-17 ENCOUNTER — Telehealth: Payer: Self-pay | Admitting: Family Medicine

## 2014-10-17 NOTE — Telephone Encounter (Signed)
Pt's daughter want

## 2014-10-17 NOTE — Telephone Encounter (Signed)
Scheduled appointment, disregard note

## 2014-10-21 ENCOUNTER — Ambulatory Visit: Payer: No Typology Code available for payment source | Admitting: Family Medicine

## 2014-10-28 ENCOUNTER — Telehealth: Payer: Self-pay | Admitting: *Deleted

## 2014-10-28 NOTE — Telephone Encounter (Signed)
Left voicemail for patient to call back. To check if patient have received the flu vaccine.

## 2014-11-18 ENCOUNTER — Other Ambulatory Visit: Payer: Self-pay | Admitting: *Deleted

## 2014-11-18 MED ORDER — RIVASTIGMINE 4.6 MG/24HR TD PT24: 4.6000 mg | MEDICATED_PATCH | TRANSDERMAL | Status: DC

## 2015-01-09 ENCOUNTER — Telehealth: Payer: Self-pay | Admitting: Family Medicine

## 2015-01-09 NOTE — Telephone Encounter (Signed)
Patient Name: Brandy Wyatt  DOB: 1945/08/15    Initial Comment Caller states mother was in for the cords in hands tightening; is getting worse; was given several options; now wants to try one of those options;    Nurse Assessment  Nurse: Mallie Mussel, RN, Alveta Heimlich Date/Time Eilene Ghazi Time): 01/09/2015 1:34:10 PM  Confirm and document reason for call. If symptomatic, describe symptoms. ---Caller states that her mother is not with her at this time. States that her mother was seen at the office about her hand. Her hand is getting worse. She was seen by a PA or someone who went over some options for her hand. [name] wanting to see about trying one of the options. She does not remember the name of the one she last saw. She thinks there was someone there at the office that could do some type of procedure for her. I did a warm transfer to [place] for further assistance.  Has the patient traveled out of the country within the last 30 days? ---Not Applicable  Does the patient require triage? ---No     Guidelines    Guideline Title Affirmed Question Affirmed Notes       Final Disposition User   Clinical Call Mallie Mussel, RN, Alveta Heimlich

## 2015-01-09 NOTE — Telephone Encounter (Signed)
Pt has appt with Dr Lorelei Pont on 01/15/15 per Webb Silversmith NP.

## 2015-01-15 ENCOUNTER — Ambulatory Visit (INDEPENDENT_AMBULATORY_CARE_PROVIDER_SITE_OTHER): Payer: Medicare Other | Admitting: Family Medicine

## 2015-01-15 ENCOUNTER — Encounter: Payer: Self-pay | Admitting: Family Medicine

## 2015-01-15 VITALS — BP 114/80 | HR 56 | Temp 97.7°F | Ht 68.0 in | Wt 202.8 lb

## 2015-01-15 DIAGNOSIS — M720 Palmar fascial fibromatosis [Dupuytren]: Secondary | ICD-10-CM | POA: Diagnosis not present

## 2015-01-15 HISTORY — DX: Palmar fascial fibromatosis (dupuytren): M72.0

## 2015-01-15 MED ORDER — METHYLPREDNISOLONE ACETATE 40 MG/ML IJ SUSP
20.0000 mg | Freq: Once | INTRAMUSCULAR | Status: AC
  Administered 2015-01-15: 20 mg via INTRA_ARTICULAR

## 2015-01-15 NOTE — Progress Notes (Signed)
Dr. Frederico Hamman T. Eithen Castiglia, MD, [facility] and Sports Medicine [place], [address] Phone: [phone] Fax: [phone]  01/15/2015  Patient: Brandy Wyatt, MRN: [id number], DOB: Mar 08, 1946, 69 y.o.  Primary Physician:  Arnette Norris, MD  Chief Complaint: Hand Pain  Subjective:   Brandy Wyatt is a 69 y.o. very pleasant female patient who presents with the following:  R 4th dupuytren's bilateral, Lesser L 4th. Pleasant patient who is been having Dupuytren's contractures at least for 4 or 5 months on the right greater than the left side.  The right side is been progressing some since that time.  This is the first time I'm evaluated the patient.  [name] not really having any pain, but she does have a flexion minimal deformity and increased tension on the flexor tendon.  Past Medical History, Surgical History, Social History, Family History, Problem List, Medications, and Allergies have been reviewed and updated if relevant.  GEN: No fevers, chills. Nontoxic. Primarily MSK c/o today. MSK: Detailed in the HPI GI: tolerating PO intake without difficulty Neuro: No numbness, parasthesias, or tingling associated. Otherwise the pertinent positives of the ROS are noted above.   Objective:   BP 114/80 mmHg  Pulse 56  Temp(Src) 97.7 F (36.5 C) (Oral)  Ht 5\' 8"  (1.727 m)  Wt 202 lb 12 oz (91.967 kg)  BMI 30.84 kg/m2   GEN: WDWN, NAD, Non-toxic, Alert & Oriented x 3 HEENT: Atraumatic, Normocephalic.  Ears and Nose: No external deformity. EXTR: No clubbing/cyanosis/edema NEURO: Normal gait.  PSYCH: Normally interactive. Conversant. Not depressed or anxious appearing.  Calm demeanor.    Bone and joint: With the exception of the fourth flexor tendons on each hand, the entirety of the hand and wrist is normal except for mild osteoarthritic changes.  On the flexor tendon on the right therefore distinct nodules with 2 larger nodules and a small bit of  contracture.  [name] able to fully extend all fingers at this point.  On the left there is some Dupuytren's contracture to a lesser degree.  There is no triggering.  Radiology: No results found.  Assessment and Plan:   Dupuytren's contracture of right hand  Dupuytren's contracture of both hands - Plan: methylPREDNISolone acetate (DEPO-MEDROL) injection 20 mg, methylPREDNISolone acetate (DEPO-MEDROL) injection 20 mg  Challenging condition.  If it progresses at all in any way at all, I would prefer to involve hand surgery.  The patient in her daughter indicated that they understood.  We are going to try some intranodular steroids to see if this helps, we will do this now and repeat in 6 weeks.  Dupuytren's Contracture Injection, R 4th Verbal consent was obtained. Risks (including rare risk of infection, potential risk for skin lightening and potential atrophy), benefits and alternatives were discussed. Prepped with Chloraprep and Ethyl Chloride used for anesthesia. Under sterile conditions, patient injected at 4 distinct nodules and into tendon sheath. Medication flowed freely without resistance.  Needle size: 22 gauge 1 1/2 inch Injection: 1/2 cc of Lidocaine 1% and Depo-Medrol 20 mg   Follow-up: 6 weeks  New Prescriptions   No medications on file   No orders of the defined types were placed in this encounter.    Signed,  Maud Deed. Adan Baehr, MD   Patient's Medications  New Prescriptions   No medications on file  Previous Medications   ASPIRIN 81 MG TABLET    Take 81 mg by mouth daily.  MULTIPLE VITAMIN (MULTIVITAMIN) TABLET    Take 1 tablet by mouth daily.   RIVASTIGMINE (EXELON) 4.6 MG/24HR    Place 1 patch (4.6 mg total) onto the skin 1 day or 1 dose.  Modified Medications   No medications on file  Discontinued Medications   NON FORMULARY    Procera AVH for memory- Take 3 by mouth daily.

## 2015-01-15 NOTE — Progress Notes (Signed)
Pre visit review using our clinic review tool, if applicable. No additional management support is needed unless otherwise documented below in the visit note. 

## 2015-05-27 ENCOUNTER — Encounter: Payer: Self-pay | Admitting: Cardiology

## 2015-08-25 DIAGNOSIS — L03113 Cellulitis of right upper limb: Secondary | ICD-10-CM | POA: Diagnosis not present

## 2015-09-24 ENCOUNTER — Ambulatory Visit (INDEPENDENT_AMBULATORY_CARE_PROVIDER_SITE_OTHER): Payer: Medicare Other | Admitting: Family Medicine

## 2015-09-24 ENCOUNTER — Ambulatory Visit (INDEPENDENT_AMBULATORY_CARE_PROVIDER_SITE_OTHER)
Admission: RE | Admit: 2015-09-24 | Discharge: 2015-09-24 | Disposition: A | Discharge status: Home / Self Care | Payer: Medicare Other | Source: Ambulatory Visit | Attending: Family Medicine | Admitting: Family Medicine

## 2015-09-24 ENCOUNTER — Encounter: Payer: Self-pay | Admitting: Family Medicine

## 2015-09-24 ENCOUNTER — Ambulatory Visit
Admission: RE | Admit: 2015-09-24 | Discharge: 2015-09-24 | Disposition: A | Discharge status: Home / Self Care | Payer: Medicare Other | Source: Ambulatory Visit | Attending: Family Medicine | Admitting: Family Medicine

## 2015-09-24 VITALS — BP 124/74 | Temp 97.5°F | Ht 68.0 in | Wt 208.5 lb

## 2015-09-24 DIAGNOSIS — M25561 Pain in right knee: Secondary | ICD-10-CM

## 2015-09-24 DIAGNOSIS — M1711 Unilateral primary osteoarthritis, right knee: Secondary | ICD-10-CM | POA: Diagnosis not present

## 2015-09-24 DIAGNOSIS — M25562 Pain in left knee: Secondary | ICD-10-CM | POA: Diagnosis not present

## 2015-09-24 DIAGNOSIS — M170 Bilateral primary osteoarthritis of knee: Secondary | ICD-10-CM | POA: Diagnosis not present

## 2015-09-24 DIAGNOSIS — M1712 Unilateral primary osteoarthritis, left knee: Secondary | ICD-10-CM | POA: Diagnosis not present

## 2015-09-24 DIAGNOSIS — M720 Palmar fascial fibromatosis [Dupuytren]: Secondary | ICD-10-CM | POA: Diagnosis not present

## 2015-09-24 MED ORDER — TRAMADOL HCL 50 MG PO TABS: 50.0000 mg | ORAL_TABLET | Freq: Four times a day (QID) | ORAL | Status: DC | PRN

## 2015-09-24 MED ORDER — DICLOFENAC SODIUM 75 MG PO TBEC: 75.0000 mg | DELAYED_RELEASE_TABLET | Freq: Two times a day (BID) | ORAL | Status: DC

## 2015-09-24 NOTE — Patient Instructions (Signed)
Brandy Wyatt or Brandy Wyatt at [employer]

## 2015-09-24 NOTE — Progress Notes (Signed)
Dr. Frederico Hamman T. Marcial Pless, MD, [facility] Primary [facility] and Sports Medicine [place], [address] Phone: [phone] Fax: [phone]  09/24/2015  Patient: Brandy Wyatt, MRN: [id number], DOB: March 18, 1946, 70 y.o.  Primary Physician:  Arnette Norris, MD   Chief Complaint  Patient presents with  . Knee Pain    Bilateral   Subjective:   This 70 y.o. female patient presents with off and on 9 mo h/o B sided knee pain after no injury. No audible pop was heard. The patient has not had an effusion. No symptomatic giving-way. No mechanical clicking. Joint has not locked up. Patient has been able to walk. The patient does have pain going up and down stairs or rising from a seated position.   B knee pain:  Both knees hurt with cracking, mostly for the last 3 weeks.  Cramping B legs.  Linament and heating pad.  Baker's cyst.  Additional history from daughter, given mother's Alzheimers.  Pain location: medial and posterior Current physical activity: none Prior Knee Surgery: none Current pain meds: tylenol, rare advil Bracing: none Occupation or school level:  The PMH, PSH, Social History, Family History, Medications, and allergies have been reviewed in [facility], and have been updated if relevant.  Patient Active Problem List   Diagnosis Date Noted  . Dupuytren's contracture of right hand 01/15/2015  . Alzheimer's disease 02/22/2014  . 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 Memory loss 05/16/2013  . Depression 04/06/2012  . HYPERLIPIDEMIA 08/26/2010  . 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 BREAST CANCER, HX OF 05/01/2010    Past Medical History  Diagnosis Date  . S/P lumpectomy of breast 2005    hx of breast cancer  . Cancer ([place])   . Dupuytren's contracture of right hand 01/15/2015    Past Surgical History  Procedure Laterality Date  . Breast lumpectomy      Social History   Social History  .  Marital Status: Married    Spouse Name: N/A  . Number of Children: N/A  . Years of Education: N/A   Occupational History  . Not on file.   Social History Main Topics  . Smoking status: Never Smoker   . Smokeless tobacco: Never Used  . Alcohol Use: No  . Drug Use: No  . Sexual Activity:    Partners: Male   Other Topics Concern  . Not on file   Social History Narrative    Family History  Problem Relation Age of Onset  . Cancer Father 65    colon  . Diabetes Mother   . Stroke Mother   . Thyroid disease Mother   . Diabetes Brother     No Known Allergies  Medication list reviewed and updated in full in [facility].  ROS: no acute illness or fever MSK: above GI: tol po intake without nausea or vomitting. Neuro: no numbness, tingling, or radiculopathy O/w per hpi  Objective:   Blood pressure 124/74, temperature 97.5 F (36.4 C), temperature source Oral, height 5\' 8"  (1.727 m), weight 208 lb 8 oz (94.575 kg).  GEN: Well-developed,well-nourished,in no acute distress; alert,appropriate and cooperative throughout examination HEENT: Normocephalic and atraumatic without obvious abnormalities. Ears, externally no deformities PULM: Breathing comfortably in no respiratory distress EXT: No clubbing, cyanosis, or edema PSYCH: Normally interactive. Cooperative during the interview. Pleasant. Friendly and conversant. Not anxious or depressed appearing. Normal, full affect.  B knees Gait: Normal heel toe pattern ROM: 0-110  Effusion: neg Echymosis or edema: none Patellar tendon NT Painful PLICA: neg Patellar grind: negative Medial and lateral patellar facet loading: negative medial and lateral joint lines medial > lateral Mcmurray's neg Flexion-pinch pain Varus and valgus stress: stable Lachman: neg Ant and Post drawer: neg Hip abduction, IR, ER: WNL Hip flexion str: 5/5 Hip abd: 5/5 Quad: 5/5 VMO atrophy:No Hamstring concentric and eccentric:  5/5  Radiology: pending  Assessment and Plan:   Primary osteoarthritis of both knees  Left knee pain - Plan: DG Knee AP/LAT W/Sunrise Left  Right knee pain - Plan: DG Knee AP/LAT W/Sunrise Right  Dupuytren's contracture of both hands  Mild to moderate tricompartmental osteoarthritis bilaterally on radiographs, which fits the picture for a leg exacerbation.  Relative rest, ice, and additionally start Voltaren by mouth twice a day. Tramadol when necessary as well.  Also gave them the name of several hand surgeons given her ongoing worsening Dupuytren's contractures.  If her left knee persists in pain, then we can do an intra-articular injection.  Patient Instructions  [place] or [place] at [place]     Follow-up: 1 mo if not improved  New Prescriptions   DICLOFENAC (VOLTAREN) 75 MG EC TABLET    Take 1 tablet (75 mg total) by mouth 2 (two) times daily.   TRAMADOL (ULTRAM) 50 MG TABLET    Take 1 tablet (50 mg total) by mouth every 6 (six) hours as needed.   Orders Placed This Encounter  Procedures  . DG Knee AP/LAT W/Sunrise Left  . DG Knee AP/LAT W/Sunrise Right    Signed,  Nizar Cutler T. Girard Koontz, MD   Patient's Medications  New Prescriptions   DICLOFENAC (VOLTAREN) 75 MG EC TABLET    Take 1 tablet (75 mg total) by mouth 2 (two) times daily.   TRAMADOL (ULTRAM) 50 MG TABLET    Take 1 tablet (50 mg total) by mouth every 6 (six) hours as needed.  Previous Medications   ASPIRIN 81 MG TABLET    Take 81 mg by mouth daily.     MULTIPLE VITAMIN (MULTIVITAMIN) TABLET    Take 1 tablet by mouth daily.   RIVASTIGMINE (EXELON) 4.6 MG/24HR    Place 1 patch (4.6 mg total) onto the skin 1 day or 1 dose.  Modified Medications   No medications on file  Discontinued Medications   No medications on file

## 2015-09-24 NOTE — Progress Notes (Signed)
Pre visit review using our clinic review tool, if applicable. No additional management support is needed unless otherwise documented below in the visit note. 

## 2015-10-28 ENCOUNTER — Other Ambulatory Visit: Payer: Self-pay | Admitting: Neurology

## 2015-10-28 NOTE — Telephone Encounter (Signed)
Last OV: 02/22/14 Next OV: 000/0/0

## 2015-11-03 ENCOUNTER — Ambulatory Visit (INDEPENDENT_AMBULATORY_CARE_PROVIDER_SITE_OTHER): Payer: Medicare Other | Admitting: Family Medicine

## 2015-11-03 ENCOUNTER — Encounter: Payer: Self-pay | Admitting: Family Medicine

## 2015-11-03 VITALS — BP 110/70 | HR 67 | Temp 97.5°F | Ht 68.0 in | Wt 201.2 lb

## 2015-11-03 DIAGNOSIS — M170 Bilateral primary osteoarthritis of knee: Secondary | ICD-10-CM | POA: Diagnosis not present

## 2015-11-03 MED ORDER — METHYLPREDNISOLONE ACETATE 40 MG/ML IJ SUSP
80.0000 mg | Freq: Once | INTRAMUSCULAR | Status: AC
  Administered 2015-11-03: 80 mg via INTRA_ARTICULAR

## 2015-11-03 MED ORDER — METHYLPREDNISOLONE ACETATE 80 MG/ML IJ SUSP: 80.0000 mg | Freq: Once | INTRAMUSCULAR | Status: DC

## 2015-11-03 MED ORDER — METHYLPREDNISOLONE ACETATE 80 MG/ML IJ SUSP
80.0000 mg | Freq: Once | INTRAMUSCULAR | Status: DC
Start: 2015-11-03 — End: 2015-11-03

## 2015-11-03 NOTE — Progress Notes (Signed)
Pre visit review using our clinic review tool, if applicable. No additional management support is needed unless otherwise documented below in the visit note. 

## 2015-11-03 NOTE — Progress Notes (Signed)
   Dr. Frederico Hamman T. Jelene Albano, MD, [facility] Primary [facility] and Sports Medicine [place], [address] Phone: [phone] Fax: [phone]  11/03/2015  Patient: Brandy Wyatt, MRN: [id number], DOB: 14-Feb-1946, 70 y.o.  Primary Physician:  Arnette Norris, MD   Chief Complaint  Patient presents with  . Knee Pain    Wants Bilateral Injections    Known B OA of knees, failure conservative measures thus far.   Procedure only.  Knee Injection, R Patient verbally consented to procedure. Risks (including potential rare risk of infection), benefits, and alternatives explained. Sterilely prepped with Chloraprep. Ethyl cholride used for anesthesia. 8 cc Lidocaine 1% mixed with 2 mL Depo-Medrol 40 mg injected using the anteromedial approach without difficulty. No complications with procedure and tolerated well. Patient had decreased pain post-injection.   Knee Injection, L Patient verbally consented to procedure. Risks (including potential rare risk of infection), benefits, and alternatives explained. Sterilely prepped with Chloraprep. Ethyl cholride used for anesthesia. 8 cc Lidocaine 1% mixed with 2 mL Depo-Medrol 40 mg injected using the anteromedial approach without difficulty. No complications with procedure and tolerated well. Patient had decreased pain post-injection.   Signed,  Maud Deed. Everette Mall, MD

## 2016-06-30 ENCOUNTER — Telehealth: Payer: Self-pay | Admitting: Family Medicine

## 2016-06-30 NOTE — Telephone Encounter (Signed)
LVM for pt daughter to call back and schedule OV 30/med wellness with Dr. Deborra Medina only. Pt has Alzheimer's disease and AWV with Katha Cabal is not necessary.

## 2016-11-10 ENCOUNTER — Telehealth: Payer: Self-pay | Admitting: Family Medicine

## 2016-11-10 NOTE — Telephone Encounter (Signed)
Will call back to schedule AWV/OV 30. Only needs to see PCP.

## 2016-12-08 NOTE — Telephone Encounter (Signed)
LVM to call back to schedule AWV/OV 30. Only needs to see PCP.

## 2016-12-22 ENCOUNTER — Ambulatory Visit (INDEPENDENT_AMBULATORY_CARE_PROVIDER_SITE_OTHER): Payer: Medicare Other | Admitting: Family Medicine

## 2016-12-22 ENCOUNTER — Other Ambulatory Visit: Payer: Self-pay | Admitting: Family Medicine

## 2016-12-22 ENCOUNTER — Encounter: Payer: Self-pay | Admitting: Family Medicine

## 2016-12-22 VITALS — BP 120/82 | HR 55 | Ht 67.0 in | Wt 194.0 lb

## 2016-12-22 DIAGNOSIS — Z23 Encounter for immunization: Secondary | ICD-10-CM

## 2016-12-22 DIAGNOSIS — Z1239 Encounter for other screening for malignant neoplasm of breast: Secondary | ICD-10-CM

## 2016-12-22 DIAGNOSIS — Z1159 Encounter for screening for other viral diseases: Secondary | ICD-10-CM | POA: Diagnosis not present

## 2016-12-22 DIAGNOSIS — Z1231 Encounter for screening mammogram for malignant neoplasm of breast: Secondary | ICD-10-CM

## 2016-12-22 DIAGNOSIS — G309 Alzheimer's disease, unspecified: Secondary | ICD-10-CM

## 2016-12-22 DIAGNOSIS — Z0000 Encounter for general adult medical examination without abnormal findings: Secondary | ICD-10-CM

## 2016-12-22 DIAGNOSIS — F0280 Dementia in other diseases classified elsewhere without behavioral disturbance: Secondary | ICD-10-CM | POA: Diagnosis not present

## 2016-12-22 LAB — COMPREHENSIVE METABOLIC PANEL
ALK PHOS: 66 U/L (ref 39–117)
ALT: 13 U/L (ref 0–35)
AST: 14 U/L (ref 0–37)
Albumin: 4.6 g/dL (ref 3.5–5.2)
BUN: 16 mg/dL (ref 6–23)
CALCIUM: 9.7 mg/dL (ref 8.4–10.5)
CO2: 29 mEq/L (ref 19–32)
Chloride: 104 mEq/L (ref 96–112)
Creatinine, Ser: 0.95 mg/dL (ref 0.40–1.20)
GFR: 61.79 mL/min (ref >60.00)
Glucose, Bld: 112 mg/dL — ABNORMAL HIGH (ref 70–99)
POTASSIUM: 3.8 meq/L (ref 3.5–5.1)
Sodium: 140 mEq/L (ref 135–145)
TOTAL PROTEIN: 7.3 g/dL (ref 6.0–8.3)
Total Bilirubin: 0.6 mg/dL (ref 0.2–1.2)

## 2016-12-22 LAB — LIPID PANEL
Cholesterol: 251 mg/dL — ABNORMAL HIGH (ref 0–200)
HDL: 54.3 mg/dL (ref >39.00)
LDL Cholesterol: 177 mg/dL — ABNORMAL HIGH (ref 0–99)
NONHDL: 196.5
TRIGLYCERIDES: 98 mg/dL (ref 0.0–149.0)
Total CHOL/HDL Ratio: 5
VLDL: 19.6 mg/dL (ref 0.0–40.0)

## 2016-12-22 LAB — CBC WITH DIFFERENTIAL/PLATELET
BASOS PCT: 1 % (ref 0.0–3.0)
Basophils Absolute: 0.1 10*3/uL (ref 0.0–0.1)
EOS PCT: 2.3 % (ref 0.0–5.0)
Eosinophils Absolute: 0.1 10*3/uL (ref 0.0–0.7)
HCT: 44.6 % (ref 36.0–46.0)
Hemoglobin: 15.4 g/dL — ABNORMAL HIGH (ref 12.0–15.0)
LYMPHS ABS: 1.7 10*3/uL (ref 0.7–4.0)
Lymphocytes Relative: 27.6 % (ref 12.0–46.0)
MCHC: 34.4 g/dL (ref 30.0–36.0)
MCV: 85.9 fl (ref 78.0–100.0)
MONOS PCT: 8.6 % (ref 3.0–12.0)
Monocytes Absolute: 0.5 10*3/uL (ref 0.1–1.0)
NEUTROS ABS: 3.7 10*3/uL (ref 1.4–7.7)
NEUTROS PCT: 60.5 % (ref 43.0–77.0)
PLATELETS: 217 10*3/uL (ref 150.0–400.0)
RBC: 5.19 Mil/uL — ABNORMAL HIGH (ref 3.87–5.11)
RDW: 13 % (ref 11.5–15.5)
WBC: 6.2 10*3/uL (ref 4.0–10.5)

## 2016-12-22 LAB — TSH: TSH: 2.14 u[IU]/mL (ref 0.35–4.50)

## 2016-12-22 NOTE — Progress Notes (Signed)
Subjective:   Patient ID: Brandy Wyatt, female    DOB: 15-Jan-1946, 71 y.o.   MRN: [id number]  NYNA CHILTON is a pleasant 71 y.o. year old female who presents to clinic today with her daugther for Medicare Wellness ([facility]? LABWORK )  on 12/22/2016  HPI:  I have not seen this patient in almost 5 years.  [name] not currently taking any medications.   I have personally reviewed the Medicare Annual Wellness questionnaire and have noted 1. The patient's medical and social history 2. Their use of alcohol, tobacco or illicit drugs 3. Their current medications and supplements 4. The patient's functional ability including ADL's, fall risks, home safety risks and hearing or visual             impairment. 5. Diet and physical activities 6. Evidence for depression or mood disorders  End of life wishes discussed and updated in Social History.  The roster of all physicians providing medical care to patient - is listed in the CareTeams section of the chart.  AD- daughter feels it has progressed some, without behavioral disturbances. She does not live alone- lives with her husband.  Has not seen neurology in years.  Lab Results  Component Value Date   CHOL 220 (H) 08/24/2010   HDL 51.10 08/24/2010   LDLDIRECT 169.1 08/24/2010   TRIG 87.0 08/24/2010   CHOLHDL 4 08/24/2010   Lab Results  Component Value Date   NA 142 05/21/2013   K 4.0 05/21/2013   CL 105 05/21/2013   CO2 27 05/21/2013   Lab Results  Component Value Date   ALT 15 05/21/2013   AST 19 05/21/2013   ALKPHOS 59 05/21/2013   BILITOT 0.7 05/21/2013   No current outpatient prescriptions on file prior to visit.   No current facility-administered medications on file prior to visit.     No Known Allergies  Past Medical History:  Diagnosis Date  . Cancer ([place])   . Dupuytren's contracture of right hand 01/15/2015  . S/P lumpectomy of breast 2005   hx of breast cancer    Past Surgical History:  Procedure  Laterality Date  . BREAST LUMPECTOMY      Family History  Problem Relation Age of Onset  . Diabetes Mother   . Stroke Mother   . Thyroid disease Mother   . Cancer Father 57       colon  . Diabetes Brother     Social History   Social History  . Marital status: Married    Spouse name: N/A  . Number of children: N/A  . Years of education: N/A   Occupational History  . Not on file.   Social History Main Topics  . Smoking status: Never Smoker  . Smokeless tobacco: Never Used  . Alcohol use No  . Drug use: No  . Sexual activity: Yes    Partners: Male   Other Topics Concern  . Not on file   Social History Narrative   Does not have a living well.   Desires CPR, would not want to be on life support indefinitely.   The PMH, PSH, Social History, Family History, Medications, and allergies have been reviewed in [facility], and have been updated if relevant.   Review of Systems  Constitutional: Negative.   HENT: Negative.   Respiratory: Negative.   Cardiovascular: Negative.   Gastrointestinal: Negative.   Endocrine: Negative.   Genitourinary: Negative.   Musculoskeletal: Negative.   Allergic/Immunologic: Negative.  Neurological: Negative.   Hematological: Negative.   Psychiatric/Behavioral: Positive for confusion. Negative for self-injury and sleep disturbance. The patient is not nervous/anxious.   All other systems reviewed and are negative.      Objective:    BP 120/82   Pulse (!) 55   Ht 5\' 7"  (1.702 m)   Wt 194 lb 0.1 oz (88 kg)   SpO2 98%   BMI 30.39 kg/m    Physical Exam   General:  Well-developed,well-nourished,in no acute distress; alert,appropriate and cooperative throughout examination Head:  normocephalic and atraumatic.   Eyes:  vision grossly intact, PERRL Ears:  R ear normal and L ear normal externally, TMs clear bilaterally Nose:  no external deformity.   Mouth:  good dentition.   Neck:  No deformities, masses, or tenderness noted. Breasts:   No mass, nodules, thickening, tenderness, bulging, retraction, inflamation, nipple discharge or skin changes noted.   Lungs:  Normal respiratory effort, chest expands symmetrically. Lungs are clear to auscultation, no crackles or wheezes. Heart:  Normal rate and regular rhythm. S1 and S2 normal without gallop, murmur, click, rub or other extra sounds. Abdomen:  Bowel sounds positive,abdomen soft and non-tender without masses, organomegaly or hernias noted. Msk:  No deformity or scoliosis noted of thoracic or lumbar spine.   Extremities:  No clubbing, cyanosis, edema, or deformity noted with normal full range of motion of all joints.   Neurologic:  alert & oriented X3 and gait normal.   Skin:  Intact without suspicious lesions or rashes Cervical Nodes:  No lymphadenopathy noted Axillary Nodes:  No palpable lymphadenopathy Psych:  Cognition and judgment appear intact. Alert and cooperative with normal attention span and concentration. No apparent delusions, illusions, hallucinations       Assessment & Plan:   Medicare annual wellness visit, subsequent - Plan: Comprehensive metabolic panel, Lipid panel, TSH, CBC with Differential/Platelet  Alzheimer's dementia without behavioral disturbance, unspecified timing of dementia onset - Plan: Ambulatory referral to Neurology No Follow-up on file.

## 2016-12-22 NOTE — Progress Notes (Signed)
Pre visit review using our clinic review tool, if applicable. No additional management support is needed unless otherwise documented below in the visit note. 

## 2016-12-22 NOTE — Assessment & Plan Note (Signed)
The patients weight, height, BMI and visual acuity have been recorded in the chart.  Cognitive function assessed.   I have made referrals, counseling and provided education to the patient based review of the above and I have provided the pt with a written personalized care plan for preventive services.  Mammogram ordered. Cologuard ordered. Labs today. Prevnar 13 today.

## 2016-12-22 NOTE — Patient Instructions (Signed)
Great to see you.  Please call to schedule your mammogram.  Cologuard will be sent to your home.  We will call you with an appointment to see neurology and with your lab results.

## 2016-12-22 NOTE — Assessment & Plan Note (Signed)
Refer back to neurology for assessment medication management.

## 2016-12-23 LAB — HEPATITIS C ANTIBODY: HCV AB: NEGATIVE

## 2016-12-24 ENCOUNTER — Ambulatory Visit
Admission: RE | Admit: 2016-12-24 | Discharge: 2016-12-24 | Disposition: A | Discharge status: Home / Self Care | Payer: Medicare Other | Source: Ambulatory Visit | Attending: Family Medicine | Admitting: Family Medicine

## 2016-12-24 DIAGNOSIS — Z1231 Encounter for screening mammogram for malignant neoplasm of breast: Secondary | ICD-10-CM | POA: Diagnosis not present

## 2016-12-24 HISTORY — DX: Personal history of irradiation: Z92.3

## 2016-12-24 HISTORY — DX: Malignant neoplasm of unspecified site of unspecified female breast: C50.919

## 2017-01-04 ENCOUNTER — Encounter: Payer: Self-pay | Admitting: Neurology

## 2017-03-23 ENCOUNTER — Ambulatory Visit: Payer: Medicare Other | Admitting: Neurology

## 2017-04-12 ENCOUNTER — Telehealth: Payer: Self-pay | Admitting: Neurology

## 2017-04-12 NOTE — Telephone Encounter (Signed)
Pt's daughter called and said pt received a jury duty notice and wanted to know if Dr Tomi Likens can write a letter due to her memory issues to be excused from jury duty

## 2017-04-13 NOTE — Telephone Encounter (Signed)
LM on VM for dghtr to call me back. I asked that she check the back of the jury summons to see what the waiver for age limit is.

## 2017-04-15 NOTE — Telephone Encounter (Signed)
Pts dghter Vallarie Mare rtrnd call yesterday, advsd her I talked w/Dr Tomi Likens and he agreed that Pt should be excused from jury duty. Advsd Vallarie Mare will mail letter to her address: [address] [place] [address] Letter mailed today

## 2017-06-23 ENCOUNTER — Ambulatory Visit (INDEPENDENT_AMBULATORY_CARE_PROVIDER_SITE_OTHER): Payer: Medicare Other | Admitting: Neurology

## 2017-06-23 ENCOUNTER — Encounter: Payer: Self-pay | Admitting: Neurology

## 2017-06-23 VITALS — BP 134/82 | HR 73 | Ht 69.0 in | Wt 195.0 lb

## 2017-06-23 DIAGNOSIS — F0280 Dementia in other diseases classified elsewhere without behavioral disturbance: Secondary | ICD-10-CM

## 2017-06-23 DIAGNOSIS — G301 Alzheimer's disease with late onset: Secondary | ICD-10-CM

## 2017-06-23 MED ORDER — RIVASTIGMINE 4.6 MG/24HR TD PT24: 4.6000 mg | MEDICATED_PATCH | Freq: Every day | TRANSDERMAL | 0 refills | Status: DC

## 2017-06-23 NOTE — Progress Notes (Signed)
NEUROLOGY CONSULTATION NOTE  Brandy Wyatt MRN: [id number] DOB: Oct 16, 1945  Referring provider: Dr. Deborra Medina Primary care provider: Dr. Deborra Medina  Reason for consult:  Alzheimer's dementia  HISTORY OF PRESENT ILLNESS: Brandy Wyatt is a 71 year old right-handed woman with history of hyperlipidemia, breast cancer status post lumpectomy (2005), and depression who follows up for Alzheimer's dementia.  [name] accompanied by her daughter who supplements history.   UPDATE:  I have not seen Brandy Wyatt since July 2015.  At that time, I had started her on Aricept.  She developed side effects, so she was switched to the Exelon patch.  She discontinued it because she and her family did not appreciate any improvement in memory, but she tolerated it.  Since last visit, she had an MRA of  Head performed on 03/19/14 to evaluate for any intracranial aneurysm that may have contributed to chronic subarachnoid blood.  It was personally reviewed and it was negative.  Since last visit, she continues to live with her husband.  She no longer handles the finances.  It is performed by either her husband or daughter.  She rarely drives, usually only 1.5 miles to the [employer].  [name] not combative or agitated.  She does not experience hallucinations or delusions.  HISTORY: She began having memory problems around 2012. At first she would forget conversations and repeats questions. This progressed to where she would easily forget errands that needed to be done. She began writing post-its to help remind her, but then often she would forget that she wrote something down. She has not had any problems with recognizing faces or recalling names of acquaintances or friends or family. She has had some difficulty with certain tasks. For example, her son said she came back and says she was not able to open up the P.O. Box at the post office. This is a P.O. Box that has been used for many years and has a specific type of daily. Therefore, it  was determined that she either was trying to open the wrong box or was not turning the key correctly. [name] also had difficulty in the past cranking the car.  [name] able to perform all her activities of daily living and 10 to the house. She does all the cleaning and grocery shopping. Whenever she goes grocery shopping, she has a list, otherwise she would either by 2 many groceries or forget to by certain items. She manages all the finances of the home. She feels like that she does get frustrated pain the bills, such as trying to remember which bank account she shouldn't use or if she has enough money to pay the bills. However she has not missed any payments. She still drives. However, she has felt a little disoriented even on familiar routes. For example, she had difficulty remembering how to drive to a Therapist, art.  One time when she was driving her grandchildren to the park, she stopped through [employer] for food. She had difficulty getting back on route.  She does have a history of depression but she feels it is under fairly good control.    She says her mother had history of dementia.  She underwent formal neuropsychological testing on 01/28/14.  Testing revealed impairment in new learning and memory, language and executive functioning.  Findings are most consistent with mild to moderate Alzheimer's disease.   01/18/14 MRI BRAIN WO:  Mild diffuse cerebral atrophy, mild chronic small vessel ischemic changes.  Mild chronic bifrontal  subarachnoid hemorrhage.  Follow up MRA of head drom 03/19/14 revealed no aneurysm. 01/07/14 LABS:  B12 932, TSH 2.627  PAST MEDICAL HISTORY: Past Medical History:  Diagnosis Date  . Breast cancer ([place])   . Cancer ([place])   . Dupuytren's contracture of right hand 01/15/2015  . Personal history of radiation therapy 12/2001  . S/P lumpectomy of breast 2005   hx of breast cancer    PAST SURGICAL HISTORY: Past Surgical History:  Procedure Laterality Date  . BREAST BIOPSY  Left 10/29/2008   benign  . BREAST BIOPSY Left 06/25/2004   benign  . BREAST LUMPECTOMY Right 11/2001    MEDICATIONS: No current outpatient medications on file prior to visit.   No current facility-administered medications on file prior to visit.     ALLERGIES: No Known Allergies  FAMILY HISTORY: Family History  Problem Relation Age of Onset  . Diabetes Mother   . Stroke Mother   . Thyroid disease Mother   . Cancer Father 94       colon  . Diabetes Brother     SOCIAL HISTORY: Social History   Socioeconomic History  . Marital status: Married    Spouse name: Not on file  . Number of children: Not on file  . Years of education: Not on file  . Highest education level: Not on file  Social Needs  . Financial resource strain: Not on file  . Food insecurity - worry: Not on file  . Food insecurity - inability: Not on file  . Transportation needs - medical: Not on file  . Transportation needs - non-medical: Not on file  Occupational History  . Not on file  Tobacco Use  . Smoking status: Never Smoker  . Smokeless tobacco: Never Used  Substance and Sexual Activity  . Alcohol use: No  . Drug use: No  . Sexual activity: Yes    Partners: Male  Other Topics Concern  . Not on file  Social History Narrative   Does not have a living well.   Desires CPR, would not want to be on life support indefinitely.    REVIEW OF SYSTEMS: Constitutional: No fevers, chills, or sweats, no generalized fatigue, change in appetite Eyes: No visual changes, double vision, eye pain Ear, nose and throat: No hearing loss, ear pain, nasal congestion, sore throat Cardiovascular: No chest pain, palpitations Respiratory:  No shortness of breath at rest or with exertion, wheezes GastrointestinaI: No nausea, vomiting, diarrhea, abdominal pain, fecal incontinence Genitourinary:  No dysuria, urinary retention or frequency Musculoskeletal:  No neck pain, back pain Integumentary: No rash, pruritus,  skin lesions Neurological: as above Psychiatric: No depression, insomnia, anxiety Endocrine: No palpitations, fatigue, diaphoresis, mood swings, change in appetite, change in weight, increased thirst Hematologic/Lymphatic:  No purpura, petechiae. Allergic/Immunologic: no itchy/runny eyes, nasal congestion, recent allergic reactions, rashes  PHYSICAL EXAM: Vitals:   06/23/17 1304  BP: 134/82  Pulse: 73  SpO2: 94%   General: No acute distress.  Patient appears well-groomed.  Head:  Normocephalic/atraumatic Eyes:  fundi examined but not visualized Neck: supple, no paraspinal tenderness, full range of motion Back: No paraspinal tenderness Heart: regular rate and rhythm Lungs: Clear to auscultation bilaterally. Vascular: No carotid bruits. Neurological Exam: Mental status: alert and oriented to person and place, recent memory poor, remote memory intact, fund of knowledge intact, attention and concentration impaired, speech fluent and not dysarthric, language intact.  Not able to draw intersecting pentagons or correctly complete Trail Making Test. MMSE - Mini Mental  State Exam 06/23/2017  Orientation to time 0  Orientation to Place 5  Registration 3  Attention/ Calculation 1  Recall 0  Language- name 2 objects 2  Language- repeat 1  Language- follow 3 step command 2  Language- read & follow direction 1  Write a sentence 1  Copy design 0  Total score 16   Cranial nerves: CN I: not tested CN II: pupils equal, round and reactive to light, visual fields intact CN III, IV, VI:  full range of motion, no nystagmus, no ptosis CN V: facial sensation intact CN VII: upper and lower face symmetric CN VIII: hearing intact CN IX, X: gag intact, uvula midline CN XI: sternocleidomastoid and trapezius muscles intact CN XII: tongue midline Bulk & Tone: normal, no fasciculations. Motor:  5/5 throughout  Sensation: temperature and vibration sensation intact. Deep Tendon Reflexes:  2+  throughout, toes downgoing.  Finger to nose testing:  Without dysmetria.  Heel to shin:  Without dysmetria. Gait:  Normal station and stride.  Romberg negative.  IMPRESSION: Alzheimer's dementia  PLAN: 1.  I explained that most people do not appreciate improvement in memory when they start cholinesterase inhibitor but we remain on it because it may slow progression of the disease.  We will restart Exelon patch 4.5mg  daily.  She will have her husband monitor.  Once we titrate up to goal of 13.3mg , we will add Namenda. 2.  Based on MMSE score and performance on Trail Making Test, I instructed that she should no longer drive. 3.  Provided information on Alzheimer's and Alzheimer's support group.  POA initiated. 4.  Follow up in 9 months.  Thank you for allowing me to take part in the care of this patient.  Metta Clines, DO  CC:  Arnette Norris, MD

## 2017-06-23 NOTE — Patient Instructions (Signed)
1.  We will restart Exelon patch.  Start 4.6mg  patch.  Change patch daily.  In 4 weeks, contact me and I will increase dose.  When you get the patches, write the letter of the day or date to keep track.  Have your husband monitor to make sure the patch was changed. 2.  No driving 3.  Follow up in 9 months.   RESOURCES:  Alzheimer's website:  Alz.org  Senior Resources of [employer]: 8600474688  Tel [place]:  [phone]  www.senior-resources-[place].org/resources.cfm   Resources for common questions found under "Pathways & Protocols "  www.senior-resources-[place].org/pathways/Pathways_Menu.htm   Resources for [facility] and Assisted Living facilities:  www.ncnursinghomeguide.com   For assistance with senior care, elder law, and estate planning (POA, medical directives):  [employer]  [address], [place] [address]  Tel: [phone]  [email or web address]   Berneice Heinrich  Tel: [phone]  [email or web address]

## 2017-07-14 ENCOUNTER — Other Ambulatory Visit: Payer: Self-pay | Admitting: Neurology

## 2017-07-14 ENCOUNTER — Encounter: Payer: Self-pay | Admitting: Neurology

## 2017-07-14 MED ORDER — RIVASTIGMINE 4.6 MG/24HR TD PT24: 4.6000 mg | MEDICATED_PATCH | Freq: Every day | TRANSDERMAL | 3 refills | Status: DC

## 2017-09-28 ENCOUNTER — Encounter: Payer: Self-pay | Admitting: Neurology

## 2017-11-15 ENCOUNTER — Other Ambulatory Visit: Payer: Self-pay

## 2017-11-15 ENCOUNTER — Encounter: Payer: Self-pay | Admitting: Neurology

## 2017-11-15 MED ORDER — RIVASTIGMINE 9.5 MG/24HR TD PT24: 9.5000 mg | MEDICATED_PATCH | Freq: Every day | TRANSDERMAL | 12 refills | Status: DC

## 2017-12-01 ENCOUNTER — Encounter: Payer: Self-pay | Admitting: Neurology

## 2017-12-19 ENCOUNTER — Encounter: Payer: Self-pay | Admitting: Neurology

## 2017-12-20 MED ORDER — RIVASTIGMINE 13.3 MG/24HR TD PT24: 13.3000 mg | MEDICATED_PATCH | Freq: Every day | TRANSDERMAL | 6 refills | Status: DC

## 2018-02-23 ENCOUNTER — Encounter: Payer: Self-pay | Admitting: Neurology

## 2018-02-24 ENCOUNTER — Encounter: Payer: Self-pay | Admitting: Neurology

## 2018-02-27 ENCOUNTER — Other Ambulatory Visit: Payer: Self-pay

## 2018-02-27 MED ORDER — MEMANTINE HCL 10 MG PO TABS: 10.0000 mg | ORAL_TABLET | ORAL | 2 refills | Status: DC

## 2018-03-23 ENCOUNTER — Ambulatory Visit: Payer: Medicare Other | Admitting: Neurology

## 2018-03-23 NOTE — Progress Notes (Deleted)
NEUROLOGY FOLLOW UP OFFICE NOTE  Brandy Wyatt [id number]  HISTORY OF PRESENT ILLNESS: Brandy Wyatt is a 72 year old right-handed female with hyperlipidemia, depression and history of breast cancer s/p lumpectomy (2005) who follows up for Alzheimer's dementia.  [name] accompanied by her daughter who supplements history.    UPDATE: [name] taking Exelon patch 13.3mg  daily and Namenda 10mg  twice daily.  Last visit, she was advised not to drive.     HISTORY: She began having memory problems around 2012. At first she would forget conversations and repeats questions. This progressed to where she would easily forget errands that needed to be done. She began writing post-its to help remind her, but then often she would forget that she wrote something down. She has not had any problems with recognizing faces or recalling names of acquaintances or friends or family. She has had some difficulty with certain tasks. For example, her son said she came back and says she was not able to open up the P.O. Box at the post office. This is a P.O. Box that has been used for many years and has a specific type of daily. Therefore, it was determined that she either was trying to open the wrong box or was not turning the key correctly. [name] also had difficulty in the past cranking the car.  [name] able to perform all her activities of daily living and 10 to the house. She does all the cleaning and grocery shopping. Whenever she goes grocery shopping, she has a list, otherwise she would either by 2 many groceries or forget to by certain items. When paying bills, she began having trouble remembering bank account to use.  She began getting disoriented driving on familiar routes. For example, she had difficulty remembering how to drive to a Therapist, art.  One time when she was driving her grandchildren to the park, she stopped through [employer] for food. She had difficulty getting back on route.  She does have a history of  depression but she feels it is under fairly good control.    She says her mother had history of dementia.  She underwent formal neuropsychological testing on 01/28/14.  Testing revealed impairment in new learning and memory, language and executive functioning.  Findings are most consistent with mild to moderate Alzheimer's disease.   01/18/14 MRI BRAIN WO:  Mild diffuse cerebral atrophy, mild chronic small vessel ischemic changes.  Mild chronic bifrontal subarachnoid hemorrhage.  Follow up MRA of head drom 03/19/14 revealed no aneurysm. 01/07/14 LABS:  B12 932, TSH 2.627  PAST MEDICAL HISTORY: Past Medical History:  Diagnosis Date  . Breast cancer ([place])   . Cancer ([place])   . Dupuytren's contracture of right hand 01/15/2015  . Personal history of radiation therapy 12/2001  . S/P lumpectomy of breast 2005   hx of breast cancer    MEDICATIONS: Current Outpatient Medications on File Prior to Visit  Medication Sig Dispense Refill  . memantine (NAMENDA) 10 MG tablet Take 1 tablet (10 mg total) by mouth as directed. Take 1/2 tab once a day x1 week, then 1/2 tab twice a day x1 week, then 1 tab twice a day 60 tablet 2  . rivastigmine (EXELON) 13.3 MG/24HR Place 1 patch (13.3 mg total) onto the skin daily. 30 patch 6  . rivastigmine (EXELON) 4.6 mg/24hr Place 1 patch (4.6 mg total) onto the skin daily. 30 patch 3  . rivastigmine (EXELON) 9.5 mg/24hr Place 1 patch (9.5 mg total)  onto the skin daily. 30 patch 12   No current facility-administered medications on file prior to visit.     ALLERGIES: No Known Allergies  FAMILY HISTORY: Family History  Problem Relation Age of Onset  . Diabetes Mother   . Stroke Mother   . Thyroid disease Mother   . Cancer Father 66       colon  . Diabetes Brother    ***.  SOCIAL HISTORY: Social History   Socioeconomic History  . Marital status: Married    Spouse name: Not on file  . Number of children: Not on file  . Years of education: Not on file  .  Highest education level: Not on file  Occupational History  . Not on file  Social Needs  . Financial resource strain: Not on file  . Food insecurity:    Worry: Not on file    Inability: Not on file  . Transportation needs:    Medical: Not on file    Non-medical: Not on file  Tobacco Use  . Smoking status: Never Smoker  . Smokeless tobacco: Never Used  Substance and Sexual Activity  . Alcohol use: No  . Drug use: No  . Sexual activity: Yes    Partners: Male  Lifestyle  . Physical activity:    Days per week: Not on file    Minutes per session: Not on file  . Stress: Not on file  Relationships  . Social connections:    Talks on phone: Not on file    Gets together: Not on file    Attends religious service: Not on file    Active member of club or organization: Not on file    Attends meetings of clubs or organizations: Not on file    Relationship status: Not on file  . Intimate partner violence:    Fear of current or ex partner: Not on file    Emotionally abused: Not on file    Physically abused: Not on file    Forced sexual activity: Not on file  Other Topics Concern  . Not on file  Social History Narrative   Does not have a living well.   Desires CPR, would not want to be on life support indefinitely.    REVIEW OF SYSTEMS: Constitutional: No fevers, chills, or sweats, no generalized fatigue, change in appetite Eyes: No visual changes, double vision, eye pain Ear, nose and throat: No hearing loss, ear pain, nasal congestion, sore throat Cardiovascular: No chest pain, palpitations Respiratory:  No shortness of breath at rest or with exertion, wheezes GastrointestinaI: No nausea, vomiting, diarrhea, abdominal pain, fecal incontinence Genitourinary:  No dysuria, urinary retention or frequency Musculoskeletal:  No neck pain, back pain Integumentary: No rash, pruritus, skin lesions Neurological: as above Psychiatric: No depression, insomnia, anxiety Endocrine: No  palpitations, fatigue, diaphoresis, mood swings, change in appetite, change in weight, increased thirst Hematologic/Lymphatic:  No purpura, petechiae. Allergic/Immunologic: no itchy/runny eyes, nasal congestion, recent allergic reactions, rashes  PHYSICAL EXAM: There were no vitals filed for this visit. General: No acute distress.  Patient appears ***-groomed.  *** body habitus. Head:  Normocephalic/atraumatic Eyes:  Fundi examined but not visualized Neck: supple, no paraspinal tenderness, full range of motion Heart:  Regular rate and rhythm Lungs:  Clear to auscultation bilaterally Back: No paraspinal tenderness Neurological Exam: alert and oriented to person, place, and time. Attention span and concentration intact, recent and remote memory intact, fund of knowledge intact.  Speech fluent and not dysarthric, language intact.  CN II-XII intact. Bulk and tone normal, muscle strength 5/5 throughout.  Sensation to light touch, temperature and vibration intact.  Deep tendon reflexes 2+ throughout, toes downgoing.  Finger to nose and heel to shin testing intact.  Gait normal, Romberg negative.  IMPRESSION: ***  PLAN: ***  Brandy Clines, DO  CC: ***

## 2018-05-01 DIAGNOSIS — H52222 Regular astigmatism, left eye: Secondary | ICD-10-CM | POA: Diagnosis not present

## 2018-05-01 DIAGNOSIS — H5203 Hypermetropia, bilateral: Secondary | ICD-10-CM | POA: Diagnosis not present

## 2018-05-01 DIAGNOSIS — H524 Presbyopia: Secondary | ICD-10-CM | POA: Diagnosis not present

## 2018-05-01 DIAGNOSIS — H11002 Unspecified pterygium of left eye: Secondary | ICD-10-CM | POA: Diagnosis not present

## 2018-05-01 DIAGNOSIS — H25093 Other age-related incipient cataract, bilateral: Secondary | ICD-10-CM | POA: Diagnosis not present

## 2018-05-30 NOTE — Progress Notes (Signed)
NEUROLOGY FOLLOW UP OFFICE NOTE  Brandy Wyatt [id number]  HISTORY OF PRESENT ILLNESS: Brandy Wyatt is a 72 year old right-handed woman with hyperlipidemia, depression, and history of breast cancer status post lumpectomy in 2005 who follows up for Alzheimer's dementia.  [name] accompanied by her daughter who supplements history.  UPDATE: [name] taking Exelon 13.3mg  patch and Namenda 10mg  twice daily. She lives with her husband.  Her daughter handles most of the finances.  She does not drive.  [name] able to bath, dress and use toilet without assistance.  She no longer keeps the home tidy.  Her daughter usually takes care of that.  Her husband cooks.  Her appetite is good.  She eats a lot of candy.  She sleeps well.    HISTORY: She began having memory problems around 2012. At first she would forget conversations and repeats questions. This progressed to where she would easily forget errands that needed to be done. She began writing post-its to help remind her, but then often she would forget that she wrote something down. She has not had any problems with recognizing faces or recalling names of acquaintances or friends or family. She has had some difficulty with certain tasks. For example, her son said she came back and says she was not able to open up the P.O. Box at the post office. This is a P.O. Box that has been used for many years and has a specific type of daily. Therefore, it was determined that she either was trying to open the wrong box or was not turning the key correctly. [name] also had difficulty in the past cranking the car.    She no longer manages the finances.  She says her mother had history of dementia.  She underwent formal neuropsychological testing on 01/28/14.  Testing revealed impairment in new learning and memory, language and executive functioning.  Findings are most consistent with mild to moderate Alzheimer's disease.   01/18/14 MRI BRAIN WO:  Mild diffuse  cerebral atrophy, mild chronic small vessel ischemic changes.  Mild chronic bifrontal subarachnoid hemorrhage.  Follow up MRA of head drom 03/19/14 revealed no aneurysm. 01/07/14 LABS:  B12 932, TSH 2.627  PAST MEDICAL HISTORY: Past Medical History:  Diagnosis Date  . Breast cancer ([place])   . Cancer ([place])   . Dupuytren's contracture of right hand 01/15/2015  . Personal history of radiation therapy 12/2001  . S/P lumpectomy of breast 2005   hx of breast cancer    MEDICATIONS: Current Outpatient Medications on File Prior to Visit  Medication Sig Dispense Refill  . memantine (NAMENDA) 10 MG tablet Take 1 tablet (10 mg total) by mouth as directed. Take 1/2 tab once a day x1 week, then 1/2 tab twice a day x1 week, then 1 tab twice a day 60 tablet 2  . rivastigmine (EXELON) 13.3 MG/24HR Place 1 patch (13.3 mg total) onto the skin daily. 30 patch 6  . rivastigmine (EXELON) 4.6 mg/24hr Place 1 patch (4.6 mg total) onto the skin daily. 30 patch 3  . rivastigmine (EXELON) 9.5 mg/24hr Place 1 patch (9.5 mg total) onto the skin daily. 30 patch 12   No current facility-administered medications on file prior to visit.     ALLERGIES: No Known Allergies  FAMILY HISTORY: Family History  Problem Relation Age of Onset  . Diabetes Mother   . Stroke Mother   . Thyroid disease Mother   . Cancer Father 54  colon  . Diabetes Brother     SOCIAL HISTORY: Social History   Socioeconomic History  . Marital status: Married    Spouse name: Not on file  . Number of children: Not on file  . Years of education: Not on file  . Highest education level: Not on file  Occupational History  . Not on file  Social Needs  . Financial resource strain: Not on file  . Food insecurity:    Worry: Not on file    Inability: Not on file  . Transportation needs:    Medical: Not on file    Non-medical: Not on file  Tobacco Use  . Smoking status: Never Smoker  . Smokeless tobacco: Never Used  Substance and  Sexual Activity  . Alcohol use: No  . Drug use: No  . Sexual activity: Yes    Partners: Male  Lifestyle  . Physical activity:    Days per week: Not on file    Minutes per session: Not on file  . Stress: Not on file  Relationships  . Social connections:    Talks on phone: Not on file    Gets together: Not on file    Attends religious service: Not on file    Active member of club or organization: Not on file    Attends meetings of clubs or organizations: Not on file    Relationship status: Not on file  . Intimate partner violence:    Fear of current or ex partner: Not on file    Emotionally abused: Not on file    Physically abused: Not on file    Forced sexual activity: Not on file  Other Topics Concern  . Not on file  Social History Narrative   Does not have a living well.   Desires CPR, would not want to be on life support indefinitely.    REVIEW OF SYSTEMS: Constitutional: No fevers, chills, or sweats, no generalized fatigue, change in appetite Eyes: No visual changes, double vision, eye pain Ear, nose and throat: No hearing loss, ear pain, nasal congestion, sore throat Cardiovascular: No chest pain, palpitations Respiratory:  No shortness of breath at rest or with exertion, wheezes GastrointestinaI: No nausea, vomiting, diarrhea, abdominal pain, fecal incontinence Genitourinary:  No dysuria, urinary retention or frequency Musculoskeletal:  No neck pain, back pain Integumentary: No rash, pruritus, skin lesions Neurological: as above Psychiatric: No depression, insomnia, anxiety Endocrine: No palpitations, fatigue, diaphoresis, mood swings, change in appetite, change in weight, increased thirst Hematologic/Lymphatic:  No purpura, petechiae. Allergic/Immunologic: no itchy/runny eyes, nasal congestion, recent allergic reactions, rashes  PHYSICAL EXAM: Blood pressure (!) 150/90, pulse 64, height 5\' 9"  (1.753 m), weight 188 lb 4 oz (85.4 kg), SpO2 91 %. General: No acute  distress.  Patient appears well-groomed.  Head:  Normocephalic/atraumatic Eyes:  Fundi examined but not visualized Neck: supple, no paraspinal tenderness, full range of motion Heart:  Regular rate and rhythm Lungs:  Clear to auscultation bilaterally Back: No paraspinal tenderness Neurological Exam: alert and oriented to person and place but not time. Attention span and concentration poor, recent memory poor, remote memory intact, fund of knowledge intact.  Speech fluent and not dysarthric, language intact.   MMSE - Mini Mental State Exam 05/31/2018 06/23/2017  Orientation to time 0 0  Orientation to Place 4 5  Registration 3 3  Attention/ Calculation 0 1  Recall 0 0  Language- name 2 objects 2 2  Language- repeat 1 1  Language- follow 3 step  command 3 2  Language- read & follow direction 1 1  Write a sentence 1 1  Copy design 0 0  Total score 15 16   CN II-XII intact. Bulk and tone normal, muscle strength 5/5 throughout.  Sensation to light touch intact.  Deep tendon reflexes 2+ throughout.  Finger to nose and heel to shin testing intact.  Gait normal, Romberg negative.  IMPRESSION: Alzheimer's disease  PLAN: 1.  Continue Exelon patch 13.3 mg daily and memantine 10 mg twice daily 2.  Follow-up with PCP regarding blood pressure 3.  Follow-up in 9 months.  25 minutes spent with the patient and her daughter, over 50% spent discussing management.  Metta Clines, DO  CC: Arnette Norris, MD

## 2018-05-31 ENCOUNTER — Encounter: Payer: Self-pay | Admitting: Neurology

## 2018-05-31 ENCOUNTER — Ambulatory Visit (INDEPENDENT_AMBULATORY_CARE_PROVIDER_SITE_OTHER): Payer: Medicare Other | Admitting: Neurology

## 2018-05-31 VITALS — BP 150/90 | HR 64 | Ht 69.0 in | Wt 188.2 lb

## 2018-05-31 DIAGNOSIS — F0280 Dementia in other diseases classified elsewhere without behavioral disturbance: Secondary | ICD-10-CM | POA: Diagnosis not present

## 2018-05-31 DIAGNOSIS — I10 Essential (primary) hypertension: Secondary | ICD-10-CM

## 2018-05-31 DIAGNOSIS — G301 Alzheimer's disease with late onset: Secondary | ICD-10-CM

## 2018-05-31 NOTE — Patient Instructions (Signed)
1.  Continue Exelon 13.3mg  patch change every 24 hours and memantine 10mg  twice daily 2.  Follow up in 9 months

## 2018-07-05 ENCOUNTER — Encounter: Payer: Self-pay | Admitting: Family Medicine

## 2018-07-05 ENCOUNTER — Ambulatory Visit (HOSPITAL_BASED_OUTPATIENT_CLINIC_OR_DEPARTMENT_OTHER)
Admission: RE | Admit: 2018-07-05 | Discharge: 2018-07-05 | Disposition: A | Discharge status: Home / Self Care | Payer: Medicare Other | Source: Ambulatory Visit | Attending: Family Medicine | Admitting: Family Medicine

## 2018-07-05 ENCOUNTER — Ambulatory Visit (INDEPENDENT_AMBULATORY_CARE_PROVIDER_SITE_OTHER): Payer: Medicare Other | Admitting: Family Medicine

## 2018-07-05 VITALS — BP 120/80 | HR 65 | Temp 98.5°F | Wt 179.8 lb

## 2018-07-05 DIAGNOSIS — R1110 Vomiting, unspecified: Secondary | ICD-10-CM | POA: Diagnosis not present

## 2018-07-05 DIAGNOSIS — R1013 Epigastric pain: Secondary | ICD-10-CM

## 2018-07-05 DIAGNOSIS — K824 Cholesterolosis of gallbladder: Secondary | ICD-10-CM | POA: Diagnosis not present

## 2018-07-05 DIAGNOSIS — K828 Other specified diseases of gallbladder: Secondary | ICD-10-CM | POA: Diagnosis not present

## 2018-07-05 DIAGNOSIS — R112 Nausea with vomiting, unspecified: Secondary | ICD-10-CM | POA: Diagnosis present

## 2018-07-05 DIAGNOSIS — R55 Syncope and collapse: Secondary | ICD-10-CM

## 2018-07-05 LAB — CBC WITH DIFFERENTIAL/PLATELET
BASOS PCT: 0.3 % (ref 0.0–3.0)
Basophils Absolute: 0 10*3/uL (ref 0.0–0.1)
Eosinophils Absolute: 0 10*3/uL (ref 0.0–0.7)
Eosinophils Relative: 0.1 % (ref 0.0–5.0)
HEMATOCRIT: 45.8 % (ref 36.0–46.0)
HEMOGLOBIN: 15.9 g/dL — AB (ref 12.0–15.0)
Lymphocytes Relative: 13.8 % (ref 12.0–46.0)
Lymphs Abs: 0.7 10*3/uL (ref 0.7–4.0)
MCHC: 34.7 g/dL (ref 30.0–36.0)
MCV: 86.8 fl (ref 78.0–100.0)
Monocytes Absolute: 0.8 10*3/uL (ref 0.1–1.0)
Monocytes Relative: 14.5 % — ABNORMAL HIGH (ref 3.0–12.0)
Neutro Abs: 3.8 10*3/uL (ref 1.4–7.7)
Neutrophils Relative %: 71.3 % (ref 43.0–77.0)
Platelets: 142 10*3/uL — ABNORMAL LOW (ref 150.0–400.0)
RBC: 5.28 Mil/uL — ABNORMAL HIGH (ref 3.87–5.11)
RDW: 13.2 % (ref 11.5–15.5)
WBC: 5.3 10*3/uL (ref 4.0–10.5)

## 2018-07-05 LAB — POCT URINALYSIS DIPSTICK
Blood, UA: NEGATIVE
GLUCOSE UA: NEGATIVE
Nitrite, UA: NEGATIVE
PH UA: 5.5 (ref 5.0–8.0)
Protein, UA: POSITIVE — AB
Spec Grav, UA: >=1.03 — AB (ref 1.010–1.025)
Urobilinogen, UA: 1 E.U./dL

## 2018-07-05 LAB — COMPREHENSIVE METABOLIC PANEL
ALBUMIN: 4.7 g/dL (ref 3.5–5.2)
ALK PHOS: 78 U/L (ref 39–117)
ALT: 24 U/L (ref 0–35)
AST: 30 U/L (ref 0–37)
BUN: 16 mg/dL (ref 6–23)
CALCIUM: 9.8 mg/dL (ref 8.4–10.5)
CO2: 27 meq/L (ref 19–32)
Chloride: 100 mEq/L (ref 96–112)
Creatinine, Ser: 1.12 mg/dL (ref 0.40–1.20)
GFR: 50.88 mL/min — ABNORMAL LOW (ref >60.00)
Glucose, Bld: 119 mg/dL — ABNORMAL HIGH (ref 70–99)
Potassium: 5 mEq/L (ref 3.5–5.1)
Sodium: 135 mEq/L (ref 135–145)
TOTAL PROTEIN: 7.6 g/dL (ref 6.0–8.3)
Total Bilirubin: 0.8 mg/dL (ref 0.2–1.2)

## 2018-07-05 MED ORDER — ONDANSETRON HCL 4 MG PO TABS: 4.0000 mg | ORAL_TABLET | Freq: Three times a day (TID) | ORAL | 0 refills | Status: DC | PRN

## 2018-07-05 NOTE — Assessment & Plan Note (Addendum)
-  Concern for gallbladder etiology given nausea and vomiting with epigastric pain.  -Labs ordered and RUQ [place] requested.  -Zofran for nausea control.

## 2018-07-05 NOTE — Progress Notes (Signed)
Brandy Wyatt - 72 y.o. female MRN [id number]  Date of birth: [birth year]  Subjective Chief Complaint  Patient presents with  . Abdominal Pain    HPI Brandy Wyatt  is a 72 y.o. female with history of Alzheimer's dementia here today with complaint of recurrent episodes of nausea with vomiting and abdominal pain.  [name] accompanied by her daughter and daughter in law who report that she had a syncopal episode this morning while taking her medications.  Has not been eating well over the past few days and thinks that this contributed to syncopal episode.  She denies chest pain, shortness of breath, palpitations, dizziness or headache.  She did not hit her head and denies any pain at this time.  In regards to the nausea and vomiting this has been occurred several times over the past few months.  Typically occurs after eating and has some associated upper abdominal pain.  She denies changes to bowels including constipation or diarrhea, there is no dark stools or blood in her stool that they have noticed.  She does not recall any reflux symptoms. She does still have her gallbladder.  There has been no recent medication changes.   ROS:  A comprehensive ROS was completed and negative except as noted per HPI  No Known Allergies  Past Medical History:  Diagnosis Date  . Breast cancer ([place])   . Cancer ([place])   . Dupuytren's contracture of right hand 01/15/2015  . Personal history of radiation therapy 12/2001  . S/P lumpectomy of breast 2005   hx of breast cancer    Past Surgical History:  Procedure Laterality Date  . BREAST BIOPSY Left 10/29/2008   benign  . BREAST BIOPSY Left 06/25/2004   benign  . BREAST LUMPECTOMY Right 11/2001    Social History   Socioeconomic History  . Marital status: Married    Spouse name: Not on file  . Number of children: Not on file  . Years of education: Not on file  . Highest education level: Not on file  Occupational History  . Not on file  Social Needs  .  Financial resource strain: Not on file  . Food insecurity:    Worry: Not on file    Inability: Not on file  . Transportation needs:    Medical: Not on file    Non-medical: Not on file  Tobacco Use  . Smoking status: Never Smoker  . Smokeless tobacco: Never Used  Substance and Sexual Activity  . Alcohol use: No  . Drug use: No  . Sexual activity: Yes    Partners: Male  Lifestyle  . Physical activity:    Days per week: Not on file    Minutes per session: Not on file  . Stress: Not on file  Relationships  . Social connections:    Talks on phone: Not on file    Gets together: Not on file    Attends religious service: Not on file    Active member of club or organization: Not on file    Attends meetings of clubs or organizations: Not on file    Relationship status: Not on file  Other Topics Concern  . Not on file  Social History Narrative   Does not have a living well.   Desires CPR, would not want to be on life support indefinitely.    Family History  Problem Relation Age of Onset  . Diabetes Mother   . Stroke Mother   . Thyroid disease  Mother   . Cancer Father 69       colon  . Diabetes Brother     Health Maintenance  Topic Date Due  . Samul Dada  11/13/1965  . COLONOSCOPY  12/04/2013  . PNA vac Low Risk Adult (2 of 2 - PPSV23) 12/22/2017  . INFLUENZA VACCINE  03/09/2018  . MAMMOGRAM  12/25/2018  . DEXA SCAN  Completed  . Hepatitis C Screening  Completed    ----------------------------------------------------------------------------------------------------------------------------------------------------------------------------------------------------------------- Physical Exam BP 120/80   Pulse 65   Temp 98.5 F (36.9 C) (Oral)   Wt 179 lb 12.8 oz (81.6 kg)   SpO2 95%   BMI 26.55 kg/m   Physical Exam  Constitutional: [name] oriented to person, place, and time. She appears well-nourished. No distress.  HENT:  Head: Normocephalic and atraumatic.    Mouth/Throat: Oropharynx is clear and moist.  Eyes: No scleral icterus.  Neck: Neck supple. No thyromegaly present.  Cardiovascular: Normal rate, regular rhythm and normal heart sounds.  Pulmonary/Chest: Effort normal and breath sounds normal.  Abdominal: Soft. Bowel sounds are normal. She exhibits no distension and no mass. There is no tenderness. There is no guarding.  Musculoskeletal: Normal range of motion.  Lymphadenopathy:    She has no cervical adenopathy.  Neurological: [name] alert and oriented to person, place, and time.  Skin: Skin is warm and dry. No rash noted.  Psychiatric: She has a normal mood and affect. Her behavior is normal.    ------------------------------------------------------------------------------------------------------------------------------------------------------------------------------------------------------------------- Assessment and Plan  Recurrent vomiting -Concern for gallbladder etiology given nausea and vomiting with epigastric pain.  -Labs ordered and RUQ [place] requested.  -Zofran for nausea control.   Syncope -No symptoms of dizziness currently.  -May be related to poor PO intake over the past few days.  -EKG normal -Check UA and additional labs.

## 2018-07-05 NOTE — Assessment & Plan Note (Signed)
-  No symptoms of dizziness currently.  -May be related to poor PO intake over the past few days.  -EKG normal -Check UA and additional labs.

## 2018-07-05 NOTE — Addendum Note (Signed)
Addended by: Milford Cage on: 07/05/2018 03:07 PM   Modules accepted: Orders

## 2018-07-06 LAB — URINE CULTURE
MICRO NUMBER:: 91430813
SPECIMEN QUALITY:: ADEQUATE

## 2018-07-10 ENCOUNTER — Other Ambulatory Visit: Payer: Self-pay | Admitting: Family Medicine

## 2018-07-10 DIAGNOSIS — R1110 Vomiting, unspecified: Secondary | ICD-10-CM

## 2018-07-16 ENCOUNTER — Other Ambulatory Visit: Payer: Self-pay | Admitting: Neurology

## 2018-08-04 ENCOUNTER — Other Ambulatory Visit: Payer: Self-pay | Admitting: Neurology

## 2018-09-21 ENCOUNTER — Encounter: Payer: Self-pay | Admitting: Family Medicine

## 2019-01-04 ENCOUNTER — Telehealth: Payer: Self-pay | Admitting: Family Medicine

## 2019-01-04 NOTE — Telephone Encounter (Signed)
Called pt on behalf of Dr Deborra Medina since it has been since 2018 when she was seen last, Dr Deborra Medina wanted to set up an appt, left message

## 2019-02-22 ENCOUNTER — Ambulatory Visit (INDEPENDENT_AMBULATORY_CARE_PROVIDER_SITE_OTHER): Payer: Medicare Other | Admitting: Family Medicine

## 2019-02-22 ENCOUNTER — Other Ambulatory Visit: Payer: Self-pay

## 2019-02-22 ENCOUNTER — Encounter: Payer: Self-pay | Admitting: Family Medicine

## 2019-02-22 VITALS — BP 104/64 | HR 54 | Temp 98.5°F | Ht 68.0 in | Wt 154.0 lb

## 2019-02-22 DIAGNOSIS — M720 Palmar fascial fibromatosis [Dupuytren]: Secondary | ICD-10-CM | POA: Diagnosis not present

## 2019-02-22 NOTE — Progress Notes (Signed)
Brandy Zeitz T. Brandy Kirkendoll, MD Primary Care and [place] at [facility] [name], [address] Phone: [phone]  FAX: [name] - 73 y.o. female  MRN [id number]  Date of Birth: 03-Apr-1946  Visit Date: 02/22/2019  PCP: Lucille Passy, MD  Referred by: Lucille Passy, MD  Chief Complaint  Patient presents with  . Pain in Hands    Has had injections in the past. Seems to be getting worse.   Subjective:   Brandy Wyatt is a 73 y.o. very pleasant female patient who presents with the following:  Patient presents with severe Dupuytren's contractures on the right, fourth and fifth, as well as some early Dupuytren's contractures on the left.  They are here for some advice.  I did do some steroid injections distantly about 4 years ago, but these have progressed.  b sup 4th and 5th on the R 4th and 5th on the L  Hand surg  Past Medical History, Surgical History, Social History, Family History, Problem List, Medications, and Allergies have been reviewed and updated if relevant.  Patient Active Problem List   Diagnosis Date Noted  . Syncope 07/05/2018  . Recurrent vomiting 07/05/2018  . Medicare annual wellness visit, subsequent 12/22/2016  . Dupuytren's contracture of right hand 01/15/2015  . Alzheimer's disease ([place]) 02/22/2014  . 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 Memory loss 05/16/2013  . Depression 04/06/2012  . HYPERLIPIDEMIA 08/26/2010  . 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 BREAST CANCER, HX OF 05/01/2010    Past Medical History:  Diagnosis Date  . Breast cancer ([place])   . Cancer ([place])   . Dupuytren's contracture of right hand 01/15/2015  . Personal history of radiation therapy 12/2001  . S/P lumpectomy of breast 2005   hx of breast cancer    Past Surgical History:  Procedure Laterality Date  . BREAST BIOPSY Left 10/29/2008   benign  .  BREAST BIOPSY Left 06/25/2004   benign  . BREAST LUMPECTOMY Right 11/2001    Social History   Socioeconomic History  . Marital status: Married    Spouse name: Not on file  . Number of children: Not on file  . Years of education: Not on file  . Highest education level: Not on file  Occupational History  . Not on file  Social Needs  . Financial resource strain: Not on file  . Food insecurity    Worry: Not on file    Inability: Not on file  . Transportation needs    Medical: Not on file    Non-medical: Not on file  Tobacco Use  . Smoking status: Never Smoker  . Smokeless tobacco: Never Used  Substance and Sexual Activity  . Alcohol use: No  . Drug use: No  . Sexual activity: Yes    Partners: Male  Lifestyle  . Physical activity    Days per week: Not on file    Minutes per session: Not on file  . Stress: Not on file  Relationships  . Social Herbalist on phone: Not on file    Gets together: Not on file    Attends religious service: Not on file    Active member of club or organization: Not on file    Attends meetings of clubs or organizations: Not on file    Relationship status: Not on file  . Intimate partner violence  Fear of current or ex partner: Not on file    Emotionally abused: Not on file    Physically abused: Not on file    Forced sexual activity: Not on file  Other Topics Concern  . Not on file  Social History Narrative   Does not have a living well.   Desires CPR, would not want to be on life support indefinitely.    Family History  Problem Relation Age of Onset  . Diabetes Mother   . Stroke Mother   . Thyroid disease Mother   . Cancer Father 33       colon  . Diabetes Brother     No Known Allergies  Medication list reviewed and updated in full in [place].  GEN: No fevers, chills. Nontoxic. Primarily MSK c/o today. MSK: Detailed in the HPI GI: tolerating PO intake without difficulty Neuro: No numbness, parasthesias,  or tingling associated. Otherwise the pertinent positives of the ROS are noted above.   Objective:   BP 104/64 (BP Location: Left Arm, Patient Position: Sitting, Cuff Size: Normal)   Pulse (!) 54   Temp 98.5 F (36.9 C)   Ht 5\' 8"  (1.727 m)   Wt 154 lb (69.9 kg)   SpO2 98%   BMI 23.42 kg/m    GEN: WDWN, NAD, Non-toxic, Alert & Oriented x 3 HEENT: Atraumatic, Normocephalic.  Ears and Nose: No external deformity. EXTR: No clubbing/cyanosis/edema NEURO: Normal gait.  PSYCH: Normally interactive. Conversant. Not depressed or anxious appearing.  Calm demeanor.    Both hands have significant Dupuytren's contractures.  On the right there is a significant contracture at the fourth and fifth, and the fingers are almost on movable.  On the left there are fourth and fifth contractures that are beginning.  Radiology: No results found.  Assessment and Plan:     ICD-10-CM   1. Dupuytren's contracture of both hands  M72.0 Ambulatory referral to Hand Surgery   Consideration of Xiaflex or other therapy with hand consultation.  Follow-up: No follow-ups on file.  No orders of the defined types were placed in this encounter.  Orders Placed This Encounter  Procedures  . Ambulatory referral to Hand Surgery    Signed,  Frederico Hamman T. Meghanne Pletz, MD   Outpatient Encounter Medications as of 02/22/2019  Medication Sig  . ondansetron (ZOFRAN) 4 MG tablet Take 1 tablet (4 mg total) by mouth every 8 (eight) hours as needed for nausea or vomiting.  . rivastigmine (EXELON) 13.3 MG/24HR APPLY 1 PATCH(13.3 MG) EXTERNALLY TO THE SKIN DAILY  . [DISCONTINUED] memantine (NAMENDA) 10 MG tablet Take 1 tablet (10 mg total) by mouth 2 (two) times daily. Take one tablet twice daily   No facility-administered encounter medications on file as of 02/22/2019.

## 2019-03-01 NOTE — Progress Notes (Signed)
NEUROLOGY FOLLOW UP OFFICE NOTE  Brandy Wyatt [id number]  HISTORY OF PRESENT ILLNESS: Brandy Wyatt is a 73 year old right-handed woman with hyperlipidemia, depression, and history of breast cancer status post lumpectomy in 2005 who follows up for Alzheimer's dementia.  [name] accompanied by her daughter who supplements history.  UPDATE: [name] taking Exelon 13.3mg  patch.  She was unable to tolerate memantine.  She lives with her husband.  Her daughter handles most of the finances.  She does not drive.  [name] able to bath (although may need prompting), dress (although she may wear dirty clothes) and use toilet without assistance.  Occasional urinary incontinence but that is infrequent.  She no longer keeps the home tidy.  Her daughter usually takes care of that.  Her husband gets the groceries and cooks.  Unless her husband has to run to the store, he is always with her.  Her appetite is good.  [name] pleasant.  Not combative.  She sleeps well but now sleeps on the couch in her day clothes.  Her husband manages her medications.    HISTORY: She began having memory problems around 2012. At first she would forget conversations and repeats questions. This progressed to where she would easily forget errands that needed to be done. She began writing post-its to help remind her, but then often she would forget that she wrote something down. She has not had any problems with recognizing faces or recalling names of acquaintances or friends or family. She has had some difficulty with certain tasks. For example, her son said she came back and says she was not able to open up the P.O. Box at the post office. This is a P.O. Box that has been used for many years and has a specific type of daily. Therefore, it was determined that she either was trying to open the wrong box or was not turning the key correctly. [name] also had difficulty in the past cranking the car.   She no longer manages the finances.   She says her mother had history of dementia.  She underwent formal neuropsychological testing on 01/28/14. Testing revealed impairment in new learning and memory, language and executive functioning. Findings are most consistent with mild to moderate Alzheimer's disease.  01/18/14 MRI BRAIN WO: Mild diffuse cerebral atrophy, mild chronic small vessel ischemic changes. Mild chronic bifrontal subarachnoid hemorrhage. Follow up MRA of head drom 03/19/14 revealed no aneurysm. 01/07/14 LABS: B12 932, TSH 2.627  Past medication:  Aricept, Namenda (GI upset)  PAST MEDICAL HISTORY: Past Medical History:  Diagnosis Date  . Breast cancer ([place])   . Cancer ([place])   . Dupuytren's contracture of right hand 01/15/2015  . Personal history of radiation therapy 12/2001  . S/P lumpectomy of breast 2005   hx of breast cancer    MEDICATIONS: Current Outpatient Medications on File Prior to Visit  Medication Sig Dispense Refill  . ondansetron (ZOFRAN) 4 MG tablet Take 1 tablet (4 mg total) by mouth every 8 (eight) hours as needed for nausea or vomiting. 20 tablet 0  . rivastigmine (EXELON) 13.3 MG/24HR APPLY 1 PATCH(13.3 MG) EXTERNALLY TO THE SKIN DAILY 30 patch 6   No current facility-administered medications on file prior to visit.     ALLERGIES: No Known Allergies  FAMILY HISTORY: Family History  Problem Relation Age of Onset  . Diabetes Mother   . Stroke Mother   . Thyroid disease Mother   . Cancer Father 72  colon  . Diabetes Brother    SOCIAL HISTORY: Social History   Socioeconomic History  . Marital status: Married    Spouse name: Not on file  . Number of children: Not on file  . Years of education: Not on file  . Highest education level: Not on file  Occupational History  . Not on file  Social Needs  . Financial resource strain: Not on file  . Food insecurity    Worry: Not on file    Inability: Not on file  . Transportation needs    Medical: Not on file     Non-medical: Not on file  Tobacco Use  . Smoking status: Never Smoker  . Smokeless tobacco: Never Used  Substance and Sexual Activity  . Alcohol use: No  . Drug use: No  . Sexual activity: Yes    Partners: Male  Lifestyle  . Physical activity    Days per week: Not on file    Minutes per session: Not on file  . Stress: Not on file  Relationships  . Social Herbalist on phone: Not on file    Gets together: Not on file    Attends religious service: Not on file    Active member of club or organization: Not on file    Attends meetings of clubs or organizations: Not on file    Relationship status: Not on file  . Intimate partner violence    Fear of current or ex partner: Not on file    Emotionally abused: Not on file    Physically abused: Not on file    Forced sexual activity: Not on file  Other Topics Concern  . Not on file  Social History Narrative   Does not have a living well.   Desires CPR, would not want to be on life support indefinitely.    REVIEW OF SYSTEMS: Constitutional: No fevers, chills, or sweats, no generalized fatigue, change in appetite Eyes: No visual changes, double vision, eye pain Ear, nose and throat: No hearing loss, ear pain, nasal congestion, sore throat Cardiovascular: No chest pain, palpitations Respiratory:  No shortness of breath at rest or with exertion, wheezes GastrointestinaI: No nausea, vomiting, diarrhea, abdominal pain, fecal incontinence Genitourinary:  No dysuria, urinary retention or frequency Musculoskeletal:  No neck pain, back pain Integumentary: No rash, pruritus, skin lesions Neurological: as above Psychiatric: No depression, insomnia, anxiety Endocrine: No palpitations, fatigue, diaphoresis, mood swings, change in appetite, change in weight, increased thirst Hematologic/Lymphatic:  No purpura, petechiae. Allergic/Immunologic: no itchy/runny eyes, nasal congestion, recent allergic reactions, rashes  PHYSICAL EXAM:  Blood pressure 120/76, pulse (!) 59, temperature 98.2 F (36.8 C), temperature source Oral, height 5\' 6"  (1.676 m), weight 155 lb (70.3 kg), SpO2 97 %. General: No acute distress.  Patient appears well-groomed.   Head:  Normocephalic/atraumatic Eyes:  Fundi examined but not visualized Neck: supple, no paraspinal tenderness, full range of motion Heart:  Regular rate and rhythm Lungs:  Clear to auscultation bilaterally Back: No paraspinal tenderness Neurological Exam: alert and oriented to person, place but not time. Attention span and concentration poor, recent memory poor, remote memory intact, fund of knowledge intact.  Speech fluent and not dysarthric, language intact.   MMSE - Mini Mental State Exam 03/02/2019 05/31/2018 06/23/2017  Orientation to time 0 0 0  Orientation to Place 3 4 5   Registration 3 3 3   Attention/ Calculation 0 0 1  Recall 0 0 0  Language- name 2 objects 1  2 2  Language- repeat 0 1 1  Language- follow 3 step command 3 3 2   Language- read & follow direction 0 1 1  Write a sentence 0 1 1  Copy design 0 0 0  Total score 10 15 16    CN II-XII intact. Bulk and tone normal, muscle strength 5/5 throughout.  Sensation to light touch  intact.  Deep tendon reflexes 2+ throughout.  Finger to nose testing intact.  Gait normal, Romberg negative.  IMPRESSION: Alzheimer's dementia  PLAN: 1.  Exelon 13.3mg  patch 2.  Consider hiring an aid to help around the house (especially to let her husband run errands or take some time for himself) 3.  Try to maintain 24 hour supervision.  Provide information regarding resources. 4.  Follow up in 9 months.  25 minutes spent face to face with patient, over 50% spent discussing diagnosis and management.  Metta Clines, DO

## 2019-03-02 ENCOUNTER — Encounter: Payer: Self-pay | Admitting: Neurology

## 2019-03-02 ENCOUNTER — Other Ambulatory Visit: Payer: Self-pay

## 2019-03-02 ENCOUNTER — Ambulatory Visit (INDEPENDENT_AMBULATORY_CARE_PROVIDER_SITE_OTHER): Payer: Medicare Other | Admitting: Neurology

## 2019-03-02 VITALS — BP 120/76 | HR 59 | Temp 98.2°F | Ht 66.0 in | Wt 155.0 lb

## 2019-03-02 DIAGNOSIS — F0280 Dementia in other diseases classified elsewhere without behavioral disturbance: Secondary | ICD-10-CM

## 2019-03-02 DIAGNOSIS — G301 Alzheimer's disease with late onset: Secondary | ICD-10-CM | POA: Diagnosis not present

## 2019-03-02 NOTE — Patient Instructions (Signed)
1.  Continue Exelon patch 13.3mg , change every 24 hours 2.  Review resources in looking for an aid to help at home 3.  Try to maintain 24 hour supervision as much as possible 4.  Follow up in 9 months

## 2019-03-09 DIAGNOSIS — M720 Palmar fascial fibromatosis [Dupuytren]: Secondary | ICD-10-CM | POA: Diagnosis not present

## 2019-03-09 DIAGNOSIS — M79641 Pain in right hand: Secondary | ICD-10-CM | POA: Diagnosis not present

## 2019-03-09 DIAGNOSIS — M79642 Pain in left hand: Secondary | ICD-10-CM | POA: Diagnosis not present

## 2019-06-05 ENCOUNTER — Other Ambulatory Visit: Payer: Self-pay | Admitting: Neurology

## 2019-06-06 NOTE — Telephone Encounter (Signed)
Requested Prescriptions   Pending Prescriptions Disp Refills  . rivastigmine (EXELON) 13.3 MG/24HR [Pharmacy Med Name: RIVASTIGMINE 13.3MG /24H PATCH] 30 patch 6    Sig: APPLY 1 PATCH(13.3 MG) EXTERNALLY TO THE SKIN DAILY   Rx last filled: 08/07/18 #30 6 REFILLS  Pt last seen:03/02/19   Follow up appt scheduled:11/30/19

## 2019-11-30 ENCOUNTER — Ambulatory Visit: Payer: Medicare Other | Admitting: Neurology

## 2020-02-22 ENCOUNTER — Other Ambulatory Visit: Payer: Self-pay

## 2020-02-22 MED ORDER — TRAZODONE HCL 50 MG PO TABS: 50.0000 mg | ORAL_TABLET | Freq: Every day | ORAL | 1 refills | Status: DC

## 2020-02-27 NOTE — Progress Notes (Signed)
NEUROLOGY FOLLOW UP OFFICE NOTE  ATLANT[place] PELTO [id number]  HISTORY OF PRESENT ILLNESS: Brandy Wyatt a 74 year old right-handed woman with hyperlipidemia, depression, and history of breast cancer status post lumpectomy in 2005 who follows up for Alzheimer's dementia. [name] accompanied by her daughter who supplements history.  UPDATE: [name] taking trazodone 50mg  at bedtime.  She no longer wants to take Exelon patch.  She lives with her husband. Her daughter handles most of the finances. She does not drive. She needs assistance bathing and dressing.  She may wear dirty clothes.  She uses the toilet without assistance. Occasional urinary incontinence.  She no longer keeps the home tidy. Her daughter usually takes care of that. Her husband gets the groceries and cooks.  Unless her husband has to run to the store, he is always with her. Appetite has decreased.  She drinks two nutritional meals a day.  [name] pleasant.  Not combative.Her husband manages her medications.  She has had trouble sleeping.  Melatonin was ineffective.  We started her on trazodone last night.    HISTORY: She began having memory problems around 2012. At first she would forget conversations and repeats questions. This progressed to where she would easily forget errands that needed to be done. She began writing post-its to help remind her, but then often she would forget that she wrote something down. She has not had any problems with recognizing faces or recalling names of acquaintances or friends or family. She has had some difficulty with certain tasks. For example, her son said she came back and says she was not able to open up the P.O. Box at the post office. This is a P.O. Box that has been used for many years and has a specific type of daily. Therefore, it was determined that she either was trying to open the wrong box or was not turning the key correctly. [name] also had difficulty in the past cranking the  car.  She no longer manages the finances.  She says her mother had history of dementia.  She underwent formal neuropsychological testing on 01/28/14. Testing revealed impairment in new learning and memory, language and executive functioning. Findings are most consistent with mild to moderate Alzheimer's disease.  01/18/14 MRI BRAIN WO: Mild diffuse cerebral atrophy, mild chronic small vessel ischemic changes. Mild chronic bifrontal subarachnoid hemorrhage. Follow up MRA of head drom 03/19/14 revealed no aneurysm. 01/07/14 LABS: B12 932, TSH 2.627  Past medication:  Aricept, Namenda (GI upset)  PAST MEDICAL HISTORY: Past Medical History:  Diagnosis Date  . Breast cancer ([place])   . Cancer ([place])   . Dupuytren's contracture of right hand 01/15/2015  . Personal history of radiation therapy 12/2001  . S/P lumpectomy of breast 2005   hx of breast cancer    MEDICATIONS: Current Outpatient Medications on File Prior to Visit  Medication Sig Dispense Refill  . ondansetron (ZOFRAN) 4 MG tablet Take 1 tablet (4 mg total) by mouth every 8 (eight) hours as needed for nausea or vomiting. (Patient not taking: Reported on 03/02/2019) 20 tablet 0  . rivastigmine (EXELON) 13.3 MG/24HR APPLY 1 PATCH(13.3 MG) EXTERNALLY TO THE SKIN DAILY 30 patch 6  . traZODone (DESYREL) 50 MG tablet Take 1 tablet (50 mg total) by mouth at bedtime. Take 1/2 tablet at bedtime for a week, then increase to 1 tablet at bedtime 30 tablet 1   No current facility-administered medications on file prior to visit.    ALLERGIES: No  Known Allergies  FAMILY HISTORY: Family History  Problem Relation Age of Onset  . Diabetes Mother   . Stroke Mother   . Thyroid disease Mother   . Cancer Father 56       colon  . Diabetes Brother     SOCIAL HISTORY: Social History   Socioeconomic History  . Marital status: Married    Spouse name: Not on file  . Number of children: Not on file  . Years of education: Not on file    . Highest education level: Not on file  Occupational History  . Occupation: retired  Tobacco Use  . Smoking status: Never Smoker  . Smokeless tobacco: Never Used  Vaping Use  . Vaping Use: Never used  Substance and Sexual Activity  . Alcohol use: No  . Drug use: No  . Sexual activity: Yes    Partners: Male  Other Topics Concern  . Not on file  Social History Narrative   Does not have a living well.   Desires CPR, would not want to be on life support indefinitely.   Social Determinants of Health   Financial Resource Strain:   . Difficulty of Paying Living Expenses:   Food Insecurity:   . Worried About Charity fundraiser in the Last Year:   . Arboriculturist in the Last Year:   Transportation Needs:   . Film/video editor (Medical):   Marland Kitchen Lack of Transportation (Non-Medical):   Physical Activity:   . Days of Exercise per Week:   . Minutes of Exercise per Session:   Stress:   . Feeling of Stress :   Social Connections:   . Frequency of Communication with Friends and Family:   . Frequency of Social Gatherings with Friends and Family:   . Attends Religious Services:   . Active Member of Clubs or Organizations:   . Attends Archivist Meetings:   Marland Kitchen Marital Status:   Intimate Partner Violence:   . Fear of Current or Ex-Partner:   . Emotionally Abused:   Marland Kitchen Physically Abused:   . Sexually Abused:     PHYSICAL EXAM: Blood pressure 105/71, pulse 71, height 5\' 9"  (1.753 m), weight 136 lb 3.2 oz (61.8 kg), SpO2 99 %. General: No acute distress.  Patient appears well-groomed.   Head:  Normocephalic/atraumatic Eyes:  Fundi examined but not visualized Neck: supple, no paraspinal tenderness, full range of motion Heart:  Regular rate and rhythm Lungs:  Clear to auscultation bilaterally Back: No paraspinal tenderness Neurological Exam:  MMSE - Mini Mental State Exam 02/29/2020 03/02/2019 05/31/2018 06/23/2017  Orientation to time 0 0 0 0  Orientation to Place 1 3  4 5   Registration 0 3 3 3   Attention/ Calculation 0 0 0 1  Recall 0 0 0 0  Language- name 2 objects 1 1 2 2   Language- repeat 0 0 1 1  Language- follow 3 step command 3 3 3 2   Language- read & follow direction 0 0 1 1  Write a sentence 0 0 1 1  Copy design 0 0 0 0  Total score 5 10 15 16    CN II-XII intact. Bulk and tone normal, muscle strength 5/5 throughout.  Sensation to light touch intact.  Deep tendon reflexes 2+ throughout.  Gait normal, Romberg negative.  IMPRESSION: Major neurocognitive disorder, Alzheimer's.  Progression over past year.  PLAN: 1.  Continue Trazodone titration to 50mg  at bedtime 2.  Follow up in one year 3.  Discussed need for assistance or caregiver at home.  Total time spent with patient and reviewing chart:  39 minutes  Metta Clines, DO

## 2020-02-29 ENCOUNTER — Other Ambulatory Visit: Payer: Self-pay

## 2020-02-29 ENCOUNTER — Encounter: Payer: Self-pay | Admitting: Neurology

## 2020-02-29 ENCOUNTER — Ambulatory Visit (INDEPENDENT_AMBULATORY_CARE_PROVIDER_SITE_OTHER): Payer: Medicare Other | Admitting: Neurology

## 2020-02-29 VITALS — BP 105/71 | HR 71 | Ht 69.0 in | Wt 136.2 lb

## 2020-02-29 DIAGNOSIS — F0280 Dementia in other diseases classified elsewhere without behavioral disturbance: Secondary | ICD-10-CM

## 2020-02-29 DIAGNOSIS — G301 Alzheimer's disease with late onset: Secondary | ICD-10-CM

## 2020-02-29 NOTE — Patient Instructions (Signed)
1.  Continue trazodone 2.  Follow up in one year

## 2020-04-01 ENCOUNTER — Other Ambulatory Visit: Payer: Self-pay

## 2020-04-01 ENCOUNTER — Other Ambulatory Visit (INDEPENDENT_AMBULATORY_CARE_PROVIDER_SITE_OTHER): Payer: Medicare Other

## 2020-04-01 DIAGNOSIS — Z79899 Other long term (current) drug therapy: Secondary | ICD-10-CM

## 2020-04-01 DIAGNOSIS — F0280 Dementia in other diseases classified elsewhere without behavioral disturbance: Secondary | ICD-10-CM

## 2020-04-01 MED ORDER — DIVALPROEX SODIUM 125 MG PO CSDR: 125.0000 mg | DELAYED_RELEASE_CAPSULE | Freq: Two times a day (BID) | ORAL | 0 refills | Status: DC

## 2020-04-01 NOTE — Progress Notes (Signed)
de

## 2020-04-02 ENCOUNTER — Other Ambulatory Visit: Payer: Self-pay | Admitting: Neurology

## 2020-04-02 LAB — CBC WITH DIFFERENTIAL/PLATELET
Basophils Absolute: 0 10*3/uL (ref 0.0–0.1)
Basophils Relative: 1 % (ref 0.0–3.0)
Eosinophils Absolute: 0.1 10*3/uL (ref 0.0–0.7)
Eosinophils Relative: 1.7 % (ref 0.0–5.0)
HCT: 38 % (ref 36.0–46.0)
Hemoglobin: 13.2 g/dL (ref 12.0–15.0)
Lymphocytes Relative: 27.4 % (ref 12.0–46.0)
Lymphs Abs: 1.3 10*3/uL (ref 0.7–4.0)
MCHC: 34.6 g/dL (ref 30.0–36.0)
MCV: 89.6 fl (ref 78.0–100.0)
Monocytes Absolute: 0.5 10*3/uL (ref 0.1–1.0)
Monocytes Relative: 11.1 % (ref 3.0–12.0)
Neutro Abs: 2.8 10*3/uL (ref 1.4–7.7)
Neutrophils Relative %: 58.8 % (ref 43.0–77.0)
Platelets: 161 10*3/uL (ref 150.0–400.0)
RBC: 4.25 Mil/uL (ref 3.87–5.11)
RDW: 12.5 % (ref 11.5–15.5)
WBC: 4.7 10*3/uL (ref 4.0–10.5)

## 2020-04-02 LAB — COMPREHENSIVE METABOLIC PANEL
ALT: 12 U/L (ref 0–35)
AST: 12 U/L (ref 0–37)
Albumin: 4.3 g/dL (ref 3.5–5.2)
Alkaline Phosphatase: 41 U/L (ref 39–117)
BUN: 18 mg/dL (ref 6–23)
CO2: 32 mEq/L (ref 19–32)
Calcium: 9.2 mg/dL (ref 8.4–10.5)
Chloride: 102 mEq/L (ref 96–112)
Creatinine, Ser: 0.82 mg/dL (ref 0.40–1.20)
GFR: 68.26 mL/min (ref >60.00)
Glucose, Bld: 94 mg/dL (ref 70–99)
Potassium: 3.8 mEq/L (ref 3.5–5.1)
Sodium: 140 mEq/L (ref 135–145)
Total Bilirubin: 0.4 mg/dL (ref 0.2–1.2)
Total Protein: 6.6 g/dL (ref 6.0–8.3)

## 2020-04-16 ENCOUNTER — Other Ambulatory Visit: Payer: Self-pay | Admitting: Neurology

## 2020-04-16 ENCOUNTER — Telehealth: Payer: Self-pay | Admitting: Family Medicine

## 2020-04-16 ENCOUNTER — Other Ambulatory Visit: Payer: Self-pay

## 2020-04-16 MED ORDER — DIVALPROEX SODIUM 125 MG PO CSDR: 125.0000 mg | DELAYED_RELEASE_CAPSULE | Freq: Two times a day (BID) | ORAL | 5 refills | Status: DC

## 2020-04-16 NOTE — Telephone Encounter (Signed)
I spoke with the patient's daughter, Vallarie Mare, and she explained that the patient doesn't have a new PCP.  She said that she still goes to [facility] for primary care, but just hasn't really had a need to go lately.  She said the patient is doing well besides Alzheimers.

## 2020-04-24 DIAGNOSIS — Z23 Encounter for immunization: Secondary | ICD-10-CM | POA: Diagnosis not present

## 2020-04-29 ENCOUNTER — Other Ambulatory Visit: Payer: Self-pay

## 2020-04-30 ENCOUNTER — Ambulatory Visit (INDEPENDENT_AMBULATORY_CARE_PROVIDER_SITE_OTHER): Payer: Medicare Other

## 2020-04-30 ENCOUNTER — Encounter: Payer: Self-pay | Admitting: Nurse Practitioner

## 2020-04-30 ENCOUNTER — Ambulatory Visit (INDEPENDENT_AMBULATORY_CARE_PROVIDER_SITE_OTHER): Payer: Medicare Other | Admitting: Nurse Practitioner

## 2020-04-30 VITALS — BP 112/70 | HR 60 | Temp 98.1°F | Wt 123.4 lb

## 2020-04-30 DIAGNOSIS — M25531 Pain in right wrist: Secondary | ICD-10-CM

## 2020-04-30 DIAGNOSIS — M810 Age-related osteoporosis without current pathological fracture: Secondary | ICD-10-CM | POA: Diagnosis not present

## 2020-04-30 DIAGNOSIS — S61511A Laceration without foreign body of right wrist, initial encounter: Secondary | ICD-10-CM | POA: Diagnosis not present

## 2020-04-30 DIAGNOSIS — N3000 Acute cystitis without hematuria: Secondary | ICD-10-CM

## 2020-04-30 DIAGNOSIS — R8290 Unspecified abnormal findings in urine: Secondary | ICD-10-CM

## 2020-04-30 DIAGNOSIS — R350 Frequency of micturition: Secondary | ICD-10-CM | POA: Diagnosis not present

## 2020-04-30 DIAGNOSIS — M7989 Other specified soft tissue disorders: Secondary | ICD-10-CM | POA: Diagnosis not present

## 2020-04-30 LAB — POCT URINALYSIS DIPSTICK
Bilirubin, UA: 2
Blood, UA: NEGATIVE
Glucose, UA: NEGATIVE
Ketones, UA: 5
Nitrite, UA: POSITIVE
Protein, UA: POSITIVE — AB
Spec Grav, UA: 1.025 (ref 1.010–1.025)
Urobilinogen, UA: 1 E.U./dL
pH, UA: 6 (ref 5.0–8.0)

## 2020-04-30 MED ORDER — SULFAMETHOXAZOLE-TRIMETHOPRIM 800-160 MG PO TABS: 1.0000 | ORAL_TABLET | Freq: Two times a day (BID) | ORAL | 0 refills | Status: DC

## 2020-04-30 NOTE — Progress Notes (Signed)
Subjective:  Patient ID: Brandy Wyatt, female    DOB: 1946/04/27  Age: 74 y.o. MRN: [id number]  CC: Acute Visit (Possible UTI, pt daughter states pt has been having some mood issues and thinks this could be due a UTI. Pt also hit her right wrist on a kitchen bar and it is very painful to the touch. )  HPI Accompanied by daughter. Hx provided by daughter. She reports altered mental status in last 3weeks. Denies any constipation or diarrhea. Due to dementia oral hydration is not adequate. No fever noted, does not guard abdomen  Wt Readings from Last 3 Encounters:  04/30/20 123 lb 6.4 oz (56 kg)  02/29/20 136 lb 3.2 oz (61.8 kg)  03/02/19 155 lb (70.3 kg)   Reviewed past Medical, Social and Family history today.  Outpatient Medications Prior to Visit  Medication Sig Dispense Refill  . divalproex (DEPAKOTE SPRINKLES) 125 MG capsule Take 1 capsule (125 mg total) by mouth 2 (two) times daily. 60 capsule 5  . ondansetron (ZOFRAN) 4 MG tablet Take 1 tablet (4 mg total) by mouth every 8 (eight) hours as needed for nausea or vomiting. 20 tablet 0  . traZODone (DESYREL) 50 MG tablet Take 1 tablet (50 mg total) by mouth at bedtime. 30 tablet 5  . rivastigmine (EXELON) 13.3 MG/24HR APPLY 1 PATCH(13.3 MG) EXTERNALLY TO THE SKIN DAILY (Patient not taking: Reported on 02/29/2020) 30 patch 6   No facility-administered medications prior to visit.    ROS See HPI  Objective:  BP 112/70 (BP Location: Left Arm, Patient Position: Sitting, Cuff Size: Normal)   Pulse 60   Temp 98.1 F (36.7 C) (Temporal)   Wt 123 lb 6.4 oz (56 kg)   SpO2 98%   BMI 18.22 kg/m   Physical Exam Abdominal:     General: There is no distension.     Tenderness: There is no abdominal tenderness. There is no guarding.  Neurological:     Mental Status: [name] alert. [name] disoriented.     Comments: Follows simple commands.     Assessment & Plan:  This visit occurred during the SARS-CoV-2 public health emergency.   Safety protocols were in place, including screening questions prior to the visit, additional usage of staff PPE, and extensive cleaning of exam room while observing appropriate contact time as indicated for disinfecting solutions.   Brandy Wyatt was seen today for acute visit.  Diagnoses and all orders for this visit:  Acute cystitis without hematuria -     POCT urinalysis dipstick -     Urine Culture -     sulfamethoxazole-trimethoprim (BACTRIM DS) 800-160 MG tablet; Take 1 tablet by mouth 2 (two) times daily. With food  Abnormal finding on urinalysis -     POCT urinalysis dipstick -     Urine Culture -     sulfamethoxazole-trimethoprim (BACTRIM DS) 800-160 MG tablet; Take 1 tablet by mouth 2 (two) times daily. With food  Tear of skin of right wrist, initial encounter  Right wrist pain -     DG Wrist Complete Right    Problem List Items Addressed This Visit    None    Visit Diagnoses    Acute cystitis without hematuria    -  Primary   Relevant Medications   sulfamethoxazole-trimethoprim (BACTRIM DS) 800-160 MG tablet   Other Relevant Orders   POCT urinalysis dipstick (Completed)   Urine Culture (Completed)   Abnormal finding on urinalysis  Relevant Medications   sulfamethoxazole-trimethoprim (BACTRIM DS) 800-160 MG tablet   Other Relevant Orders   POCT urinalysis dipstick (Completed)   Urine Culture (Completed)   Tear of skin of right wrist, initial encounter       Right wrist pain       Relevant Orders   DG Wrist Complete Right (Completed)      Follow-up: Return in about 4 weeks (around 05/28/2020) for weight loss eval (F2F, 33mins).  Wilfred Lacy, NP

## 2020-04-30 NOTE — Patient Instructions (Signed)
Go to lab for x-ray  Maintain adequate oral hydration May also start probiotic (florastor or curturelle) if develop diarrhea. 1cap daily, 1hr apart from antibiotics.

## 2020-05-01 LAB — URINE CULTURE
MICRO NUMBER:: 10982297
SPECIMEN QUALITY:: ADEQUATE

## 2020-05-02 ENCOUNTER — Encounter: Payer: Self-pay | Admitting: Nurse Practitioner

## 2020-05-08 ENCOUNTER — Telehealth: Payer: Self-pay | Admitting: Nurse Practitioner

## 2020-05-08 NOTE — Telephone Encounter (Signed)
Patients daughter is calling to see if she can bring patient back in to see if UTI has cleared up. Please advise.

## 2020-05-08 NOTE — Telephone Encounter (Signed)
Spoke with patient daughter and she states her mother urine does look better so she will just hold off for now and call our office to schedule a nurse visit if she feels it is needed or if symptoms appear to be coming back.

## 2020-05-08 NOTE — Telephone Encounter (Signed)
Last ov note states patient is suppose to schedule a 4 week follow up, daughter wants her to be checked to see if she still has UTI, would you just like her to come in for a lab appointment?

## 2020-05-08 NOTE — Telephone Encounter (Signed)
Ok to enter urinalysis and reflex culture. Schedule lab appt

## 2020-05-16 ENCOUNTER — Other Ambulatory Visit: Payer: Self-pay | Admitting: Neurology

## 2020-05-20 ENCOUNTER — Encounter: Payer: Self-pay | Admitting: Nurse Practitioner

## 2020-05-20 DIAGNOSIS — R404 Transient alteration of awareness: Secondary | ICD-10-CM

## 2020-05-27 ENCOUNTER — Ambulatory Visit: Payer: Medicare Other | Admitting: Nurse Practitioner

## 2020-05-30 ENCOUNTER — Other Ambulatory Visit: Payer: Self-pay

## 2020-05-30 ENCOUNTER — Encounter: Payer: Self-pay | Admitting: Nurse Practitioner

## 2020-05-30 ENCOUNTER — Ambulatory Visit (INDEPENDENT_AMBULATORY_CARE_PROVIDER_SITE_OTHER): Payer: Medicare Other

## 2020-05-30 ENCOUNTER — Ambulatory Visit (INDEPENDENT_AMBULATORY_CARE_PROVIDER_SITE_OTHER): Payer: Medicare Other | Admitting: Nurse Practitioner

## 2020-05-30 VITALS — BP 112/70 | HR 100 | Temp 98.1°F | Wt 122.0 lb

## 2020-05-30 DIAGNOSIS — R399 Unspecified symptoms and signs involving the genitourinary system: Secondary | ICD-10-CM | POA: Diagnosis not present

## 2020-05-30 DIAGNOSIS — R404 Transient alteration of awareness: Secondary | ICD-10-CM

## 2020-05-30 DIAGNOSIS — J811 Chronic pulmonary edema: Secondary | ICD-10-CM | POA: Diagnosis not present

## 2020-05-30 DIAGNOSIS — R4182 Altered mental status, unspecified: Secondary | ICD-10-CM | POA: Diagnosis not present

## 2020-05-30 LAB — POCT URINALYSIS DIPSTICK
Bilirubin, UA: NEGATIVE
Blood, UA: NEGATIVE
Glucose, UA: NEGATIVE
Ketones, UA: NEGATIVE
Leukocytes, UA: NEGATIVE
Nitrite, UA: NEGATIVE
Protein, UA: NEGATIVE
Spec Grav, UA: 1.02 (ref 1.010–1.025)
Urobilinogen, UA: 0.2 E.U./dL
pH, UA: 6 (ref 5.0–8.0)

## 2020-05-30 NOTE — Patient Instructions (Addendum)
Please schedule appt with neurology.  Go to lab for CXR

## 2020-05-30 NOTE — Progress Notes (Signed)
Subjective:  Patient ID: Brandy Wyatt, female    DOB: 10/19/1945  Age: 74 y.o. MRN: [id number]  CC: Acute Visit (Pt daughter states pt has been experiencing alot of urinary frequency. Pt has been showing mood changes also. )  HPI Accompanied by daughter who provides all information. She reports change in behavior for last 77months, waxing and waning, states Brandy Wyatt has become verbally abusive and made some attempts to be physical as well. She has become physically toward family dog. She will resist bathing or changing her clothes. Daughter denies any change in appetite, no indication of focal pain, no constipation or diarrhea, no cough or SOB. No rash. No ausea or vomiting. Depakote and trazodone was prescribed by Dr. Tomi Likens due to current symptoms it has not made a significant difference. Daughter is concerned about possible UTI due to recent increase urination.   Reviewed past Medical, Social and Family history today.  Outpatient Medications Prior to Visit  Medication Sig Dispense Refill  . divalproex (DEPAKOTE SPRINKLE) 125 MG capsule TAKE 1 CAPSULE(125 MG) BY MOUTH TWICE DAILY 60 capsule 2  . ondansetron (ZOFRAN) 4 MG tablet Take 1 tablet (4 mg total) by mouth every 8 (eight) hours as needed for nausea or vomiting. 20 tablet 0  . traZODone (DESYREL) 50 MG tablet Take 1 tablet (50 mg total) by mouth at bedtime. 30 tablet 5  . rivastigmine (EXELON) 13.3 MG/24HR APPLY 1 PATCH(13.3 MG) EXTERNALLY TO THE SKIN DAILY (Patient not taking: Reported on 02/29/2020) 30 patch 6  . sulfamethoxazole-trimethoprim (BACTRIM DS) 800-160 MG tablet Take 1 tablet by mouth 2 (two) times daily. With food (Patient not taking: Reported on 05/30/2020) 10 tablet 0   No facility-administered medications prior to visit.    ROS See HPI  Objective:  BP 112/70 (BP Location: Left Arm, Patient Position: Sitting, Cuff Size: Normal)   Pulse 100   Temp 98.1 F (36.7 C) (Temporal)   Wt 122 lb (55.3 kg)   SpO2 98%    BMI 18.02 kg/m   Physical Exam Vitals reviewed.  Constitutional:      General: [name] not in acute distress. Eyes:     Extraocular Movements: Extraocular movements intact.     Conjunctiva/sclera: Conjunctivae normal.  Neck:     Thyroid: No thyroid mass, thyromegaly or thyroid tenderness.  Cardiovascular:     Rate and Rhythm: Normal rate and regular rhythm.     Pulses: Normal pulses.     Heart sounds: Normal heart sounds.  Pulmonary:     Effort: Pulmonary effort is normal.     Breath sounds: Normal breath sounds.  Abdominal:     General: Bowel sounds are normal. There is no distension.     Palpations: Abdomen is soft. There is no mass.     Tenderness: There is no abdominal tenderness. There is no guarding.     Hernia: No hernia is present.  Musculoskeletal:     Cervical back: Normal range of motion and neck supple. No tenderness.  Lymphadenopathy:     Cervical: No cervical adenopathy.  Skin:    General: Skin is warm and dry.     Findings: No rash.  Neurological:     Mental Status: [name] alert.  Psychiatric:        Mood and Affect: Mood normal.        Behavior: Behavior normal.    Assessment & Plan:  This visit occurred during the SARS-CoV-2 public health emergency.  Safety protocols were in place,  including screening questions prior to the visit, additional usage of staff PPE, and extensive cleaning of exam room while observing appropriate contact time as indicated for disinfecting solutions.   Brandy Wyatt was seen today for acute visit.  Diagnoses and all orders for this visit:  UTI symptoms -     POCT Urinalysis Dipstick -     Urine Culture  Transient alteration of awareness -     DG Chest 2 View  POCT urinalysis does not shoe any obvious bladder infection. Urine sent for culture. Normal CBC and CMP completd last month. No obvious sign of acute infection at this time. Pending CXR. Advised to f/up with neurology for medication management.  Problem List Items  Addressed This Visit    None    Visit Diagnoses    UTI symptoms    -  Primary   Relevant Orders   POCT Urinalysis Dipstick (Completed)   Urine Culture   Transient alteration of awareness       Relevant Orders   DG Chest 2 View      Follow-up: No follow-ups on file.  Wilfred Lacy, NP

## 2020-05-31 LAB — URINE CULTURE
MICRO NUMBER:: 11107350
SPECIMEN QUALITY:: ADEQUATE

## 2020-06-02 ENCOUNTER — Ambulatory Visit: Payer: Medicare Other | Admitting: Neurology

## 2020-06-09 ENCOUNTER — Other Ambulatory Visit: Payer: Self-pay

## 2020-06-09 MED ORDER — DIVALPROEX SODIUM 250 MG PO DR TAB: 250.0000 mg | DELAYED_RELEASE_TABLET | Freq: Two times a day (BID) | ORAL | 9 refills | Status: DC

## 2020-06-09 NOTE — Progress Notes (Signed)
Order added per DR. Jaffe note on 06/02/20

## 2020-06-19 ENCOUNTER — Other Ambulatory Visit: Payer: Self-pay | Admitting: Neurology

## 2020-06-30 ENCOUNTER — Other Ambulatory Visit: Payer: Self-pay

## 2020-06-30 MED ORDER — DIVALPROEX SODIUM 125 MG PO CSDR: DELAYED_RELEASE_CAPSULE | ORAL | 2 refills | Status: DC

## 2020-06-30 NOTE — Progress Notes (Signed)
Per DR. Jaffe and pt request Depakote 125 sprinkle cap, direction take two capsules twice a day sent to pt pharmacy.

## 2020-07-08 ENCOUNTER — Ambulatory Visit: Payer: Medicare Other

## 2020-07-18 ENCOUNTER — Telehealth: Payer: Self-pay | Admitting: Nurse Practitioner

## 2020-07-18 NOTE — Telephone Encounter (Signed)
Declined per daughter.  She stated patient is not dong well and her scheduled to too busy at this time for AWV

## 2020-08-04 ENCOUNTER — Other Ambulatory Visit: Payer: Self-pay

## 2020-08-04 ENCOUNTER — Telehealth: Payer: Self-pay | Admitting: Neurology

## 2020-08-04 MED ORDER — DIVALPROEX SODIUM 125 MG PO CSDR
DELAYED_RELEASE_CAPSULE | ORAL | 2 refills | Status: DC
Start: 2020-08-04 — End: 2023-11-04

## 2020-08-13 ENCOUNTER — Telehealth: Payer: Self-pay | Admitting: Nurse Practitioner

## 2020-08-13 NOTE — Telephone Encounter (Signed)
[facility] is calling to get orders for Hospice (daughter thinks patient needs this because of dementia). Kendal Hymen also wants to know if Claris Gower will be her attending for Hospice. Please call her at [phone] and advise.

## 2020-08-15 NOTE — Telephone Encounter (Signed)
ok 

## 2020-08-16 ENCOUNTER — Encounter: Payer: Self-pay | Admitting: Nurse Practitioner

## 2020-08-17 ENCOUNTER — Other Ambulatory Visit: Payer: Self-pay

## 2020-08-17 ENCOUNTER — Encounter (HOSPITAL_COMMUNITY): Payer: Self-pay

## 2020-08-17 ENCOUNTER — Emergency Department (HOSPITAL_COMMUNITY)
Admission: EM | Admit: 2020-08-17 | Discharge: 2020-08-19 | Disposition: A | Discharge status: Home / Self Care | Payer: Medicare Other | Attending: Emergency Medicine | Admitting: Emergency Medicine

## 2020-08-17 DIAGNOSIS — R410 Disorientation, unspecified: Secondary | ICD-10-CM | POA: Diagnosis not present

## 2020-08-17 DIAGNOSIS — R531 Weakness: Secondary | ICD-10-CM | POA: Diagnosis not present

## 2020-08-17 DIAGNOSIS — M6281 Muscle weakness (generalized): Secondary | ICD-10-CM | POA: Diagnosis not present

## 2020-08-17 DIAGNOSIS — R2681 Unsteadiness on feet: Secondary | ICD-10-CM | POA: Diagnosis not present

## 2020-08-17 DIAGNOSIS — U071 COVID-19: Secondary | ICD-10-CM | POA: Insufficient documentation

## 2020-08-17 DIAGNOSIS — R296 Repeated falls: Secondary | ICD-10-CM | POA: Insufficient documentation

## 2020-08-17 DIAGNOSIS — Z853 Personal history of malignant neoplasm of breast: Secondary | ICD-10-CM | POA: Diagnosis not present

## 2020-08-17 DIAGNOSIS — W19XXXA Unspecified fall, initial encounter: Secondary | ICD-10-CM | POA: Diagnosis not present

## 2020-08-17 DIAGNOSIS — U099 Post covid-19 condition, unspecified: Secondary | ICD-10-CM

## 2020-08-17 DIAGNOSIS — Z9181 History of falling: Secondary | ICD-10-CM | POA: Insufficient documentation

## 2020-08-17 DIAGNOSIS — F0390 Unspecified dementia without behavioral disturbance: Secondary | ICD-10-CM | POA: Diagnosis not present

## 2020-08-17 NOTE — ED Triage Notes (Signed)
Patient arrived by [facility]AR from home following weakness since having Covid early December. Patient reported fall from couch and has dementia. Alert to baseline. No obvious trauma noted

## 2020-08-18 ENCOUNTER — Emergency Department (HOSPITAL_COMMUNITY): Payer: Medicare Other

## 2020-08-18 DIAGNOSIS — U071 COVID-19: Secondary | ICD-10-CM | POA: Diagnosis not present

## 2020-08-18 DIAGNOSIS — F0390 Unspecified dementia without behavioral disturbance: Secondary | ICD-10-CM | POA: Diagnosis not present

## 2020-08-18 DIAGNOSIS — R531 Weakness: Secondary | ICD-10-CM | POA: Diagnosis not present

## 2020-08-18 DIAGNOSIS — R410 Disorientation, unspecified: Secondary | ICD-10-CM | POA: Diagnosis not present

## 2020-08-18 DIAGNOSIS — U099 Post covid-19 condition, unspecified: Secondary | ICD-10-CM | POA: Diagnosis not present

## 2020-08-18 LAB — CBC WITH DIFFERENTIAL/PLATELET
Abs Immature Granulocytes: 0.04 10*3/uL (ref 0.00–0.07)
Basophils Absolute: 0 10*3/uL (ref 0.0–0.1)
Basophils Relative: 1 %
Eosinophils Absolute: 0.1 10*3/uL (ref 0.0–0.5)
Eosinophils Relative: 1 %
HCT: 48.5 % — ABNORMAL HIGH (ref 36.0–46.0)
Hemoglobin: 16.4 g/dL — ABNORMAL HIGH (ref 12.0–15.0)
Immature Granulocytes: 1 %
Lymphocytes Relative: 22 %
Lymphs Abs: 1.2 10*3/uL (ref 0.7–4.0)
MCH: 29.8 pg (ref 26.0–34.0)
MCHC: 33.8 g/dL (ref 30.0–36.0)
MCV: 88 fL (ref 80.0–100.0)
Monocytes Absolute: 0.4 10*3/uL (ref 0.1–1.0)
Monocytes Relative: 8 %
Neutro Abs: 3.8 10*3/uL (ref 1.7–7.7)
Neutrophils Relative %: 67 %
Platelets: 301 10*3/uL (ref 150–400)
RBC: 5.51 MIL/uL — ABNORMAL HIGH (ref 3.87–5.11)
RDW: 11.9 % (ref 11.5–15.5)
WBC: 5.6 10*3/uL (ref 4.0–10.5)
nRBC: 0 % (ref 0.0–0.2)

## 2020-08-18 LAB — COMPREHENSIVE METABOLIC PANEL
ALT: 25 U/L (ref 0–44)
AST: 18 U/L (ref 15–41)
Albumin: 4 g/dL (ref 3.5–5.0)
Alkaline Phosphatase: 46 U/L (ref 38–126)
Anion gap: 14 (ref 5–15)
BUN: 11 mg/dL (ref 8–23)
CO2: 27 mmol/L (ref 22–32)
Calcium: 9.4 mg/dL (ref 8.9–10.3)
Chloride: 102 mmol/L (ref 98–111)
Creatinine, Ser: 0.62 mg/dL (ref 0.44–1.00)
GFR, Estimated: >60 mL/min (ref >60)
Glucose, Bld: 93 mg/dL (ref 70–99)
Potassium: 3.6 mmol/L (ref 3.5–5.1)
Sodium: 143 mmol/L (ref 135–145)
Total Bilirubin: 1 mg/dL (ref 0.3–1.2)
Total Protein: 6.8 g/dL (ref 6.5–8.1)

## 2020-08-18 LAB — URINALYSIS, ROUTINE W REFLEX MICROSCOPIC
Bilirubin Urine: NEGATIVE
Glucose, UA: NEGATIVE mg/dL
Hgb urine dipstick: NEGATIVE
Ketones, ur: NEGATIVE mg/dL
Leukocytes,Ua: NEGATIVE
Nitrite: NEGATIVE
Protein, ur: NEGATIVE mg/dL
Specific Gravity, Urine: 1.021 (ref 1.005–1.030)
pH: 6 (ref 5.0–8.0)

## 2020-08-18 LAB — RESP PANEL BY RT-PCR (FLU A&B, COVID) ARPGX2
Influenza A by PCR: NEGATIVE
Influenza B by PCR: NEGATIVE
SARS Coronavirus 2 by RT PCR: POSITIVE — AB

## 2020-08-18 MED ORDER — SODIUM CHLORIDE 0.9 % IV BOLUS
1000.0000 mL | Freq: Once | INTRAVENOUS | Status: AC
  Administered 2020-08-18: 1000 mL via INTRAVENOUS

## 2020-08-18 NOTE — ED Notes (Signed)
Patient transported to CT 

## 2020-08-18 NOTE — ED Notes (Signed)
Pt called 3x for room w/o response.

## 2020-08-18 NOTE — NC FL2 (Signed)
  [place] MEDICAID FL2 LEVEL OF CARE SCREENING TOOL     IDENTIFICATION  Patient Name: Brandy Wyatt Birthdate: [birth year] Sex: female Admission Date (Current Location): 08/17/2020  [facility] and [place] Number:  Herbalist and Address:  [facility], [place] [address] [place], [place] [address]      Provider Number: [id number]  Attending Physician Name and Address:  Davonna Belling, MD  Relative Name and Phone Number:  Duke Salvia Daughter [phone]    Current Level of Care: SNF Recommended Level of Care: [place] Prior Approval Number:    Date Approved/Denied:   PASRR Number: [id number]  Discharge Plan: SNF    Current Diagnoses: Patient Active Problem List   Diagnosis Date Noted  . Syncope 07/05/2018  . Recurrent vomiting 07/05/2018  . Medicare annual wellness visit, subsequent 12/22/2016  . Dupuytren's contracture of right hand 01/15/2015  . Alzheimer's disease ([place]) 02/22/2014  . 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 Memory loss 05/16/2013  . Depression 04/06/2012  . HYPERLIPIDEMIA 08/26/2010  . 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 BREAST CANCER, HX OF 05/01/2010    Orientation RESPIRATION BLADDER Height & Weight     Self  Normal External catheter,Incontinent Weight:   Height:     BEHAVIORAL SYMPTOMS/MOOD NEUROLOGICAL BOWEL NUTRITION STATUS      Incontinent Diet (Regular)  AMBULATORY STATUS COMMUNICATION OF NEEDS Skin   Total Care Verbally Normal                       Personal Care Assistance Level of Assistance  Bathing,Feeding,Dressing Bathing Assistance: Maximum assistance Feeding assistance: Limited assistance Dressing Assistance: Maximum assistance     Functional Limitations Info  Sight,Hearing,Speech Sight Info: Adequate Hearing Info: Adequate Speech Info: Adequate    SPECIAL CARE FACTORS FREQUENCY  PT (By licensed PT),OT (By  licensed OT)     PT Frequency: 5x weekly OT Frequency: 5x weekly            Contractures Contractures Info: Present    Additional Factors Info                  Current Medications (08/18/2020):  This is the current hospital active medication list No current facility-administered medications for this encounter.   Current Outpatient Medications  Medication Sig Dispense Refill  . divalproex (DEPAKOTE SPRINKLE) 125 MG capsule Take 2 capsules twice a day (Patient taking differently: Take 250 mg by mouth 2 (two) times daily.) 120 capsule 2  . traZODone (DESYREL) 50 MG tablet Take 1 tablet (50 mg total) by mouth at bedtime. 30 tablet 5  . ondansetron (ZOFRAN) 4 MG tablet Take 1 tablet (4 mg total) by mouth every 8 (eight) hours as needed for nausea or vomiting. (Patient not taking: No sig reported) 20 tablet 0     Discharge Medications: Please see discharge summary for a list of discharge medications.  Relevant Imaging Results:  Relevant Lab Results:   Additional Information SSN# [id number]  Vergie Living, LCSW

## 2020-08-18 NOTE — Progress Notes (Signed)
CSW faxed out referrals to family's preferred SNF, [facility].as well as several other SNFs in the [place] area.

## 2020-08-18 NOTE — ED Notes (Signed)
Pt placed on pure wick at this time, family at bedside.

## 2020-08-18 NOTE — ED Notes (Signed)
Called patient for vital update patient didn't answer

## 2020-08-18 NOTE — ED Provider Notes (Signed)
  Physical Exam  BP (!) 100/56   Pulse 69   Temp 98.6 F (37 C)   Resp 18   SpO2 97%   Physical Exam  ED Course/Procedures     Procedures  MDM  Patient still pending possible placement.  Urine done and reassuring.  COVID test positive but likely still positive from previous infection and has cleared her quarantine.  Patient is medically cleared.       Davonna Belling, MD 08/18/20 2251

## 2020-08-18 NOTE — Progress Notes (Signed)
Manufacturing engineer [facility])  Hospital Liaison RN note         Notified by [facility] manager of patient/family request for [facility] Palliative services at home after discharge.         Writer to speak with patient's daughter on Tuesday to confirm interest and explain services.               [facility] Palliative team will follow up with patient after discharge.         Please call with any hospice or palliative related questions.         Thank you for the opportunity to participate in this patient's care.     Domenic Moras, BSN, RN [facility] Liaison (listed on [place] under [facility])    [phone] (24h on call)

## 2020-08-18 NOTE — ED Provider Notes (Signed)
[facility] EMERGENCY DEPARTMENT Provider Note   CSN: [id number] Arrival date & time: 08/17/20  1357     History No chief complaint on file.   Brandy Wyatt is a 75 y.o. female.  HPI      COVID 12/25 Prior to that ha hx of dementia, but was high energy, walking around, "couldn't keep up with her" Since having COVID has not been herself, would like COVID rehab. She has been unable to walk for the last 2 weeks. December 25 was tested.  Try to pick her up, screams like it hurts, crumples,weak all over, has to be physically picked up to be transferred--has wheelchair at home they have been placing her in, having to feed her, wear depends. Worried she is developing sores. Had mild cough, congestion but otherwise did not have symptoms, cough, congestion improved.  No recent fevers, nausea, vomiting, diarrhea, black or bloody stools.  Yellow.  Has had UTIs before. Fell from couch Has not slept in bedroom for 2 years. Other day was third time she rolled off of the couch.  No LOC, does not believe injuries from fall. Family feels unable to care for her anymore.     Past Medical History:  Diagnosis Date  . Breast cancer (HCC)   . Cancer (HCC)   . Dupuytren's contracture of right hand 01/15/2015  . Personal history of radiation therapy 12/2001  . S/P lumpectomy of breast 2005   hx of breast cancer    Patient Active Problem List   Diagnosis Date Noted  . Syncope 07/05/2018  . Recurrent vomiting 07/05/2018  . Medicare annual wellness visit, subsequent 12/22/2016  . Dupuytren's contracture of right hand 01/15/2015  . Alzheimer's disease (HCC) 02/22/2014  . Spontaneous subarachnoid intracranial hemorrhage, chronic (HCC) 02/22/2014  . Memory loss 05/16/2013  . Depression 04/06/2012  . HYPERLIPIDEMIA 08/26/2010  . 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 BREAST CANCER, HX OF 05/01/2010    Past Surgical History:  Procedure Laterality Date  . BREAST  BIOPSY Left 10/29/2008   benign  . BREAST BIOPSY Left 06/25/2004   benign  . BREAST LUMPECTOMY Right 11/2001     OB History   No obstetric history on file.     Family History  Problem Relation Age of Onset  . Diabetes Mother   . Stroke Mother   . Thyroid disease Mother   . Cancer Father 25       colon  . Diabetes Brother     Social History   Tobacco Use  . Smoking status: Never Smoker  . Smokeless tobacco: Never Used  Vaping Use  . Vaping Use: Never used  Substance Use Topics  . Alcohol use: No  . Drug use: No    Home Medications Prior to Admission medications   Medication Sig Start Date End Date Taking? Authorizing Provider  divalproex (DEPAKOTE SPRINKLE) 125 MG capsule Take 2 capsules twice a day Patient taking differently: Take 250 mg by mouth 2 (two) times daily. 08/04/20  Yes Jaffe, Adam R, DO  traZODone (DESYREL) 50 MG tablet Take 1 tablet (50 mg total) by mouth at bedtime. 04/17/20  Yes Jaffe, Adam R, DO  ondansetron (ZOFRAN) 4 MG tablet Take 1 tablet (4 mg total) by mouth every 8 (eight) hours as needed for nausea or vomiting. Patient not taking: No sig reported 07/05/18   Everrett Coombe, DO    Allergies    Patient has no known allergies.  Review of Systems  Review of Systems  Unable to perform ROS: Dementia  Constitutional: Positive for activity change, appetite change and fatigue. Negative for fever.  HENT: Negative for congestion.   Respiratory: Negative for cough and shortness of breath.   Cardiovascular: Negative for chest pain.  Gastrointestinal: Negative for abdominal pain, diarrhea, nausea and vomiting.  Musculoskeletal: Negative for back pain.  Skin: Negative for rash.  Neurological: Negative for headaches.    Physical Exam Updated Vital Signs BP (!) 100/56   Pulse 69   Temp 98.6 F (37 C)   Resp 18   SpO2 97%   Physical Exam Vitals and nursing note reviewed.  Constitutional:      General: [name] not in acute distress.     Appearance: [name] well-developed and well-nourished. [name] not diaphoretic.  HENT:     Head: Normocephalic and atraumatic.  Eyes:     Extraocular Movements: EOM normal.     Conjunctiva/sclera: Conjunctivae normal.  Cardiovascular:     Rate and Rhythm: Normal rate and regular rhythm.     Pulses: Intact distal pulses.     Heart sounds: Normal heart sounds. No murmur heard. No friction rub. No gallop.   Pulmonary:     Effort: Pulmonary effort is normal. No respiratory distress.     Breath sounds: Normal breath sounds. No wheezing or rales.  Abdominal:     General: There is no distension.     Palpations: Abdomen is soft.     Tenderness: There is no abdominal tenderness. There is no guarding.  Musculoskeletal:        General: No tenderness or edema.     Cervical back: Normal range of motion.  Skin:    General: Skin is warm and dry.     Findings: No erythema or rash.  Neurological:     Mental Status: [name] alert.     Comments: Does not follow some commands, symmetric smile, normal EOM, appears to have normal strength bilaterally. Reports normal sensation. Tongue midline     ED Results / Procedures / Treatments   Labs (all labs ordered are listed, but only abnormal results are displayed) Labs Reviewed  RESP PANEL BY RT-PCR (FLU A&B, COVID) ARPGX2 - Abnormal; Notable for the following components:      Result Value   SARS Coronavirus 2 by RT PCR POSITIVE (*)    All other components within normal limits  CBC WITH DIFFERENTIAL/PLATELET - Abnormal; Notable for the following components:   RBC 5.51 (*)    Hemoglobin 16.4 (*)    HCT 48.5 (*)    All other components within normal limits  URINE CULTURE  COMPREHENSIVE METABOLIC PANEL  URINALYSIS, ROUTINE W REFLEX MICROSCOPIC    EKG EKG Interpretation  Date/Time:  Monday August 18 2020 10:24:00 EST Ventricular Rate:  57 PR Interval:    QRS Duration: 102 QT Interval:  452 QTC Calculation: 441 R Axis:   60 Text  Interpretation: Sinus rhythm Anteroseptal infarct, old ST elevation, consider inferior injury No old tracing to compare Confirmed by Davonna Belling [phone]) on 08/18/2020 4:10:58 PM   Radiology CT Head Wo Contrast  Result Date: 08/18/2020 CLINICAL DATA:  Dementia. Fall from bed. Confusion. EXAM: CT HEAD WITHOUT CONTRAST TECHNIQUE: Contiguous axial images were obtained from the base of the skull through the vertex without intravenous contrast. COMPARISON:  None. FINDINGS: Brain: Motion degraded scan, limiting assessment. No evidence of parenchymal hemorrhage or extra-axial fluid collection. No mass lesion, mass effect, or midline shift. No CT evidence  of acute infarction. Generalized cerebral volume loss. Nonspecific prominent subcortical and periventricular white matter hypodensity, most in keeping with chronic small vessel ischemic change. Cerebral ventricle sizes are concordant with the degree of cerebral volume loss. Vascular: No acute abnormality. Skull: No evidence of calvarial fracture. Sinuses/Orbits: The visualized paranasal sinuses are essentially clear. Other:  The mastoid air cells are unopacified. IMPRESSION: 1. Limited motion degraded scan. No evidence of acute intracranial abnormality. No evidence of calvarial fracture. 2. Generalized cerebral volume loss and prominent chronic small vessel ischemic changes in the cerebral white matter. Electronically Signed   By: Ilona Sorrel M.D.   On: 08/18/2020 11:21   DG Chest Portable 1 View  Result Date: 08/18/2020 CLINICAL DATA:  Weakness.  COVID infection in December. EXAM: PORTABLE CHEST 1 VIEW COMPARISON:  05/30/2020. FINDINGS: Patient is somewhat rotated. Trachea is midline. Heart size normal. Biapical pleural thickening. There may be a calcified granuloma at the right lung base. Lungs are hyperinflated but otherwise clear. Noted pleural fluid. IMPRESSION: Hyperinflation without acute finding. Electronically Signed   By: Lorin Picket M.D.   On:  08/18/2020 10:49    Procedures Procedures (including critical care time)  Medications Ordered in ED Medications  sodium chloride 0.9 % bolus 1,000 mL (0 mLs Intravenous Stopped 08/18/20 2139)    ED Course  I have reviewed the triage vital signs and the nursing notes.  Pertinent labs & imaging results that were available during my care of the patient were reviewed by me and considered in my medical decision making (see chart for details).    MDM Rules/Calculators/A&P                          75 year old female with history of Alzheimer's disease, spontaneous subarachnoid hemorrhage, hyperlipidemia, history of breast cancer, recent diagnosis of COVID-19 December 25,  presents with concern for continuing generalized weakness, fall from couch, inability to ambulate since her COVID 19 diagnosis.  Labs show no significant electrolyte abnormalities, anemia.  CT head done given history of fall and declining status shows no acute findings.  No focal abnormalities on exam and have low suspicion for CVA.  No sign of sepsis.  Urinalysis pending at time of care but returned negative. CXR without acute findings.  Consulted transitions of care team to discuss home health or SNF placement.  Encouraged family to continue home care if able with home health and seek placement as outpatient but they would prefer placement. SW evaluated and hopefull we can find placement. COVID test also pending--given infection 12/25 possible she may still test positive.  Pt medically cleared and does not meet criteria for admission to the hospital and is currently awaiting placement.    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

## 2020-08-18 NOTE — Evaluation (Signed)
Physical Therapy Evaluation Patient Details Name: Brandy Wyatt MRN: [id number] DOB: 1945-10-20 Today's Date: 08/18/2020   History of Present Illness  Pt is a 76 y/o female presenting to the ED for weakness and multiple falls. Per family, since getting COVID in December, pt has had a steady decline in mobility. PMH includes dementia.  Clinical Impression  Pt admitted secondary to problem above with deficits below. Pt requiring total A for bed mobility. Attempted to stand, however, pt very resistive and pushing posteriorly. Per daughter, prior to catching covid a few weeks ago, pt was very independent. Since then, has become progressively weaker and has had multiple falls. Recommending SNF level therapies at d/c. Will continue to follow acutely.     Follow Up Recommendations SNF;Supervision/Assistance - 24 hour    Equipment Recommendations  Wheelchair cushion (measurements PT);Wheelchair (measurements PT);Hospital bed (hoyer lift with pad)    Recommendations for Other Services       Precautions / Restrictions Precautions Precautions: Fall Precaution Comments: Has had multiple falls at home Restrictions Weight Bearing Restrictions: No      Mobility  Bed Mobility Overal bed mobility: Needs Assistance Bed Mobility: Supine to Sit;Sit to Supine     Supine to sit: Total assist Sit to supine: Total assist   General bed mobility comments: Pt not assisting with mobility. Once upright, pt with increased alertness, however, would push posteriorly. Attempted to stand, however, pt very resistive, so further mobility deferred.    Transfers                 General transfer comment: unable  Ambulation/Gait                Stairs            Wheelchair Mobility    Modified Rankin (Stroke Patients Only)       Balance Overall balance assessment: Needs assistance Sitting-balance support: Bilateral upper extremity supported;Feet supported Sitting balance-Leahy  Scale: Poor Sitting balance - Comments: Reliant on min up to max A to maintain sitting balance.                                     Pertinent Vitals/Pain Pain Assessment: Faces Faces Pain Scale: No hurt    Home Living Family/patient expects to be discharged to:: Private residence Living Arrangements: Spouse/significant other;Other (Comment) (caregiver) Available Help at Discharge: Family;Personal care attendant;Available PRN/intermittently Type of Home: House Home Access: Stairs to enter Entrance Stairs-Rails: Psychiatric nurse of Steps: 3 Home Layout: One level Home Equipment: Wheelchair - manual Additional Comments: Has caregiver 5X/week for various hours    Prior Function Level of Independence: Needs assistance   Gait / Transfers Assistance Needed: Since having covid, pt has had multiple falls and has been unable to ambulate. Prior to covid, was independent.  ADL's / Homemaking Assistance Needed: Requires total A for bathing/dressing, while prior to covid was independent.        Hand Dominance        Extremity/Trunk Assessment   Upper Extremity Assessment Upper Extremity Assessment: Generalized weakness;RUE deficits/detail RUE Deficits / Details: R hand contracture in ring and pinky fingers    Lower Extremity Assessment Lower Extremity Assessment: Generalized weakness    Cervical / Trunk Assessment Cervical / Trunk Assessment: Kyphotic  Communication   Communication: Expressive difficulties;HOH  Cognition Arousal/Alertness: Lethargic Behavior During Therapy: Flat affect Overall Cognitive Status: History of cognitive impairments - at  baseline                                 General Comments: Dementia at baseline. Pt keeping eyes closed throughout majority of session. Tangential throughout and speaking of things unrelated to tasks.      General Comments      Exercises     Assessment/Plan    PT Assessment  Patient needs continued PT services  PT Problem List Decreased strength;Decreased balance;Decreased mobility;Decreased knowledge of use of DME;Decreased cognition;Decreased safety awareness;Decreased knowledge of precautions;Decreased activity tolerance       PT Treatment Interventions DME instruction;Gait training;Functional mobility training;Therapeutic activities;Therapeutic exercise;Balance training;Patient/family education;Wheelchair mobility training;Cognitive remediation    PT Goals (Current goals can be found in the Care Plan section)  Acute Rehab PT Goals Patient Stated Goal: for pt to get SNF per daughter PT Goal Formulation: With patient Time For Goal Achievement: 09/01/20 Potential to Achieve Goals: Good    Frequency Min 2X/week   Barriers to discharge        Co-evaluation               AM-PAC PT "6 Clicks" Mobility  Outcome Measure Help needed turning from your back to your side while in a flat bed without using bedrails?: Total Help needed moving from lying on your back to sitting on the side of a flat bed without using bedrails?: Total Help needed moving to and from a bed to a chair (including a wheelchair)?: Total Help needed standing up from a chair using your arms (e.g., wheelchair or bedside chair)?: Total Help needed to walk in hospital room?: Total Help needed climbing 3-5 steps with a railing? : Total 6 Click Score: 6    End of Session Equipment Utilized During Treatment: Gait belt Activity Tolerance: Patient limited by lethargy Patient left: in bed;with call bell/phone within reach;with family/visitor present (on stretcher in ED) Nurse Communication: Mobility status PT Visit Diagnosis: Unsteadiness on feet (R26.81);Muscle weakness (generalized) (M62.81);History of falling (Z91.81);Repeated falls (R29.6)    Time: 9371-6967 PT Time Calculation (min) (ACUTE ONLY): 23 min   Charges:   PT Evaluation $PT Eval Moderate Complexity: 1 Mod PT  Treatments $Therapeutic Activity: 8-22 mins        Lou Miner, DPT  Acute Rehabilitation Services  Pager: [phone] Office: [phone]   Rudean Hitt 08/18/2020, 4:45 PM

## 2020-08-18 NOTE — Progress Notes (Signed)
CSW met with Pt at bedside.  Pt is pleasant, but oriented only to self.  Daughter Vallarie Mare present during assessment to provide collateral information. Until recent Covid diagnosis, Pt was physically active and able to participate in her care. CSW will seek SNF placement as recommended by PT.   08/18/20 1900  TOC Assessment  Expected Discharge Plan [place]  Final next level of care Skilled Nursing Facility  Barriers to Discharge SNF Covid;SNF Covid Recovering  Patient states their goals for this hospitalization and ongoing recovery are: To get back on her feet (per daughter)  CMS Medicare.gov Compare Post Acute Care list provided to: Patient Represenative (must comment) Duke Salvia Daughter [phone])  Choice offered to / list presented to  Adult Children Duke Salvia Daughter [phone])  [place]  Living arrangements for the past 2 months Single Family Home  Lives with: Spouse  Do you feel safe going back to the place where you live? N (Frequent falls)  Share Information with NAME Duke Salvia Daughter [phone]  Patient language and need for interpreter reviewed:  (not needed)  Criminal Activity/Legal Involvement Pertinent to Current Situation/Hospitalization No - Comment as needed  Need for Family Participation in Patient Care Y  Care giver support system in place? Y (Lives with spouse)  Appearance: Appears stated age  Affect (typically observed) Pleasant  Orientation:  Oriented to Self  Alcohol / Substance Use Not Applicable  Psych Involvement N

## 2020-08-18 NOTE — Progress Notes (Signed)
   08/18/20 1351  TOC ED Mini Assessment  TOC Time spent with patient (minutes): 30  TOC Time saved using PING (minutes): 20  PING Used in TOC Assessment Yes  Admission or Readmission Diverted Yes  Interventions which prevented an admission or readmission [place] or Services  What brought you to the Emergency Department?  Fall  Barriers to Discharge ED Unsafe disposition  Barrier interventions PT consult, [place] consult   RNCM met with pt and daughter at bedside to discuss disposition plan.  Daughter did most of talking as pt has dementia and oriented to self.  Daughter states pt has home caregiver and spouse at home but since having COVID, pt has become immobile.  Daughter inquired about home palliative, vs home PT vs skilled nursing facility.  RNCM placed referral to [facility] and PT for disposition recommendations.  RNCM will continue to follow.

## 2020-08-18 NOTE — Care Management (Signed)
Patient's family seeking SNF placement discussed with ED CSW, who will continue to follow up.

## 2020-08-19 DIAGNOSIS — G309 Alzheimer's disease, unspecified: Secondary | ICD-10-CM | POA: Diagnosis not present

## 2020-08-19 DIAGNOSIS — Z853 Personal history of malignant neoplasm of breast: Secondary | ICD-10-CM | POA: Diagnosis not present

## 2020-08-19 DIAGNOSIS — R5381 Other malaise: Secondary | ICD-10-CM | POA: Diagnosis not present

## 2020-08-19 DIAGNOSIS — F339 Major depressive disorder, recurrent, unspecified: Secondary | ICD-10-CM | POA: Diagnosis not present

## 2020-08-19 DIAGNOSIS — R262 Difficulty in walking, not elsewhere classified: Secondary | ICD-10-CM | POA: Diagnosis not present

## 2020-08-19 DIAGNOSIS — G4700 Insomnia, unspecified: Secondary | ICD-10-CM | POA: Diagnosis not present

## 2020-08-19 DIAGNOSIS — F4323 Adjustment disorder with mixed anxiety and depressed mood: Secondary | ICD-10-CM | POA: Diagnosis not present

## 2020-08-19 DIAGNOSIS — Z9010 Acquired absence of unspecified breast and nipple: Secondary | ICD-10-CM | POA: Diagnosis not present

## 2020-08-19 DIAGNOSIS — W19XXXA Unspecified fall, initial encounter: Secondary | ICD-10-CM | POA: Diagnosis not present

## 2020-08-19 DIAGNOSIS — U099 Post covid-19 condition, unspecified: Secondary | ICD-10-CM | POA: Diagnosis not present

## 2020-08-19 DIAGNOSIS — Z7401 Bed confinement status: Secondary | ICD-10-CM | POA: Diagnosis not present

## 2020-08-19 DIAGNOSIS — R2681 Unsteadiness on feet: Secondary | ICD-10-CM | POA: Diagnosis not present

## 2020-08-19 DIAGNOSIS — E785 Hyperlipidemia, unspecified: Secondary | ICD-10-CM | POA: Diagnosis not present

## 2020-08-19 DIAGNOSIS — M6281 Muscle weakness (generalized): Secondary | ICD-10-CM | POA: Diagnosis not present

## 2020-08-19 DIAGNOSIS — M2550 Pain in unspecified joint: Secondary | ICD-10-CM | POA: Diagnosis not present

## 2020-08-19 DIAGNOSIS — F0390 Unspecified dementia without behavioral disturbance: Secondary | ICD-10-CM | POA: Diagnosis not present

## 2020-08-19 DIAGNOSIS — E46 Unspecified protein-calorie malnutrition: Secondary | ICD-10-CM | POA: Diagnosis not present

## 2020-08-19 DIAGNOSIS — E441 Mild protein-calorie malnutrition: Secondary | ICD-10-CM | POA: Diagnosis not present

## 2020-08-19 DIAGNOSIS — K5900 Constipation, unspecified: Secondary | ICD-10-CM | POA: Diagnosis not present

## 2020-08-19 DIAGNOSIS — U071 COVID-19: Secondary | ICD-10-CM | POA: Diagnosis not present

## 2020-08-19 DIAGNOSIS — M810 Age-related osteoporosis without current pathological fracture: Secondary | ICD-10-CM | POA: Diagnosis not present

## 2020-08-19 DIAGNOSIS — R531 Weakness: Secondary | ICD-10-CM | POA: Diagnosis not present

## 2020-08-19 DIAGNOSIS — M720 Palmar fascial fibromatosis [Dupuytren]: Secondary | ICD-10-CM | POA: Diagnosis not present

## 2020-08-19 DIAGNOSIS — F0391 Unspecified dementia with behavioral disturbance: Secondary | ICD-10-CM | POA: Diagnosis not present

## 2020-08-19 NOTE — ED Notes (Signed)
Pt refusing to have vitals checked after multiple attempts.

## 2020-08-19 NOTE — Progress Notes (Signed)
Manufacturing engineer [facility])  Hospital Liaison RN note         Notified by [facility] manager of patient/family request for [facility] Palliative services after discharge.         Writer spoke with patient's daughter Vallarie Mare to confirm interest and explain services.  Discussion had about possibility of hospice support  at home.  Vallarie Mare shares hope that her mother can regain some strength and function in rehab and appreciates the offer of outpatient palliative support in that setting.  [facility]iative team will follow up with patient after discharge.         Please call with any hospice or palliative related questions.         Thank you for the opportunity to participate in this patient's care.     Domenic Moras, BSN, RN [facility] Liaison (listed on [place] under Hospice[facility])    [phone] (24h on call)

## 2020-08-19 NOTE — ED Notes (Signed)
[facility] at bedside at this time.  
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
## 2020-08-19 NOTE — ED Notes (Addendum)
Called [facility] for pt pickup. No ETA at this time. Talked to [facility]. Per [facility] there are 15 pick ups on the list before the pt.

## 2020-08-19 NOTE — Progress Notes (Signed)
Pt accepted to [facility].  # for report [phone] Daughter, Vallarie Mare updated via phone @ [phone].
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
## 2020-08-20 LAB — URINE CULTURE: Culture: NO GROWTH

## 2020-08-22 DIAGNOSIS — R262 Difficulty in walking, not elsewhere classified: Secondary | ICD-10-CM | POA: Diagnosis not present

## 2020-08-22 DIAGNOSIS — F339 Major depressive disorder, recurrent, unspecified: Secondary | ICD-10-CM | POA: Diagnosis not present

## 2020-08-22 DIAGNOSIS — F0391 Unspecified dementia with behavioral disturbance: Secondary | ICD-10-CM | POA: Diagnosis not present

## 2020-08-22 DIAGNOSIS — E441 Mild protein-calorie malnutrition: Secondary | ICD-10-CM | POA: Diagnosis not present

## 2020-10-08 ENCOUNTER — Other Ambulatory Visit: Payer: Self-pay | Admitting: Nurse Practitioner

## 2020-10-08 DIAGNOSIS — Z681 Body mass index (BMI) 19 or less, adult: Secondary | ICD-10-CM | POA: Diagnosis not present

## 2020-10-08 DIAGNOSIS — E46 Unspecified protein-calorie malnutrition: Secondary | ICD-10-CM | POA: Diagnosis not present

## 2020-10-08 DIAGNOSIS — Z8616 Personal history of COVID-19: Secondary | ICD-10-CM | POA: Diagnosis not present

## 2020-10-08 DIAGNOSIS — R634 Abnormal weight loss: Secondary | ICD-10-CM | POA: Diagnosis not present

## 2020-10-08 DIAGNOSIS — R410 Disorientation, unspecified: Secondary | ICD-10-CM | POA: Diagnosis not present

## 2020-10-08 DIAGNOSIS — F32A Depression, unspecified: Secondary | ICD-10-CM | POA: Diagnosis not present

## 2020-10-08 DIAGNOSIS — Z8679 Personal history of other diseases of the circulatory system: Secondary | ICD-10-CM | POA: Diagnosis not present

## 2020-10-08 DIAGNOSIS — F0281 Dementia in other diseases classified elsewhere with behavioral disturbance: Secondary | ICD-10-CM | POA: Diagnosis not present

## 2020-10-08 DIAGNOSIS — G301 Alzheimer's disease with late onset: Secondary | ICD-10-CM | POA: Diagnosis not present

## 2020-10-08 DIAGNOSIS — Z853 Personal history of malignant neoplasm of breast: Secondary | ICD-10-CM | POA: Diagnosis not present

## 2020-10-09 DIAGNOSIS — R634 Abnormal weight loss: Secondary | ICD-10-CM | POA: Diagnosis not present

## 2020-10-09 DIAGNOSIS — G301 Alzheimer's disease with late onset: Secondary | ICD-10-CM | POA: Diagnosis not present

## 2020-10-09 DIAGNOSIS — Z681 Body mass index (BMI) 19 or less, adult: Secondary | ICD-10-CM | POA: Diagnosis not present

## 2020-10-09 DIAGNOSIS — E46 Unspecified protein-calorie malnutrition: Secondary | ICD-10-CM | POA: Diagnosis not present

## 2020-10-09 DIAGNOSIS — F0281 Dementia in other diseases classified elsewhere with behavioral disturbance: Secondary | ICD-10-CM | POA: Diagnosis not present

## 2020-10-09 DIAGNOSIS — Z8616 Personal history of COVID-19: Secondary | ICD-10-CM | POA: Diagnosis not present

## 2020-10-10 ENCOUNTER — Telehealth: Payer: Self-pay | Admitting: Nurse Practitioner

## 2020-10-10 DIAGNOSIS — F0280 Dementia in other diseases classified elsewhere without behavioral disturbance: Secondary | ICD-10-CM

## 2020-10-10 DIAGNOSIS — G309 Alzheimer's disease, unspecified: Secondary | ICD-10-CM

## 2020-10-10 NOTE — Telephone Encounter (Signed)
-----   Message from Dannielle Karvonen, RN sent at 10/09/2020 11:18 AM EST ----- Regarding: RE: CCM Embedded referral I am the [facility] care management case manager assigned to your practice.  I received notification on 10/08/2020 that Ms Brower was discharged from the skilled nursing facility on 10/07/2020 after an approximately 49 day stay.  Her diagnosis for that admission was Dementia and weakness.  I would like to collaborate with you regarding her care by providing Chronic case management services/ follow up if you feel this will be beneficial.  I can contact your front office and request a post hospital/SNF follow up appointment for this patient with you.   ----- Message ----- From: Flossie Buffy, NP Sent: 10/08/2020   8:29 PM EST To: Dannielle Karvonen, RN Subject: RE: CCM Embedded referral                      I have not seen this patient in office since 05/2020. Which diagnosis prompts this referral? Or Can you arrange for an office visit with me?  ----- Message ----- From: Dannielle Karvonen, RN Sent: 10/08/2020   9:15 AM EST To: Flossie Buffy, NP Subject: CCM Embedded referral                          Good morning Ms. Weslee Fogg,   Your patient is eligible for CCM services and the embedded care coordination team assigned to your practice and patients is available to provide assistance. However, a referral order is required for initiation of CCM services. Your assistance with placement of an order utilizing the [id number] referral order, selecting "Embedded Chronic Care Management Services", requesting team member (Pharmacist, Nurse, Social Worker), and comment about specific need is appreciated.   Thank you, Quinn Plowman RN,BSN,CCM RN Case Manager [place]  [phone]

## 2020-10-13 DIAGNOSIS — F0281 Dementia in other diseases classified elsewhere with behavioral disturbance: Secondary | ICD-10-CM | POA: Diagnosis not present

## 2020-10-13 DIAGNOSIS — G301 Alzheimer's disease with late onset: Secondary | ICD-10-CM | POA: Diagnosis not present

## 2020-10-13 DIAGNOSIS — Z681 Body mass index (BMI) 19 or less, adult: Secondary | ICD-10-CM | POA: Diagnosis not present

## 2020-10-13 DIAGNOSIS — Z8616 Personal history of COVID-19: Secondary | ICD-10-CM | POA: Diagnosis not present

## 2020-10-13 DIAGNOSIS — R634 Abnormal weight loss: Secondary | ICD-10-CM | POA: Diagnosis not present

## 2020-10-13 DIAGNOSIS — E46 Unspecified protein-calorie malnutrition: Secondary | ICD-10-CM | POA: Diagnosis not present

## 2020-10-13 NOTE — Telephone Encounter (Signed)
Brandy Wyatt tried to call to schedule and pts daughter called back and said that pt is bed ridden and is under hospice care and that the only way she would be able to get to the office would be by ambulance. She wanted me to make you aware.

## 2020-10-14 NOTE — Telephone Encounter (Signed)
Based on patient's daughter's response below, is it possible to assist them in making arrangements for non emergent transportation with an ambulance? Or do you know if Hospice taking over her care?

## 2020-10-15 ENCOUNTER — Telehealth: Payer: Medicare Other

## 2020-10-15 ENCOUNTER — Ambulatory Visit: Payer: Medicare Other

## 2020-10-15 DIAGNOSIS — F0281 Dementia in other diseases classified elsewhere with behavioral disturbance: Secondary | ICD-10-CM | POA: Diagnosis not present

## 2020-10-15 DIAGNOSIS — G301 Alzheimer's disease with late onset: Secondary | ICD-10-CM | POA: Diagnosis not present

## 2020-10-15 DIAGNOSIS — R413 Other amnesia: Secondary | ICD-10-CM

## 2020-10-15 DIAGNOSIS — G309 Alzheimer's disease, unspecified: Secondary | ICD-10-CM

## 2020-10-15 DIAGNOSIS — Z8616 Personal history of COVID-19: Secondary | ICD-10-CM | POA: Diagnosis not present

## 2020-10-15 DIAGNOSIS — Z681 Body mass index (BMI) 19 or less, adult: Secondary | ICD-10-CM | POA: Diagnosis not present

## 2020-10-15 DIAGNOSIS — E46 Unspecified protein-calorie malnutrition: Secondary | ICD-10-CM | POA: Diagnosis not present

## 2020-10-15 DIAGNOSIS — R634 Abnormal weight loss: Secondary | ICD-10-CM | POA: Diagnosis not present

## 2020-10-15 DIAGNOSIS — F0280 Dementia in other diseases classified elsewhere without behavioral disturbance: Secondary | ICD-10-CM

## 2020-10-15 NOTE — Chronic Care Management (AMB) (Signed)
   10/15/2020  [place] 1946-02-10 [id number]  Referral received from primary care provider for Chronic case management services.   Message received from primary care provider regarding assistance with arranging an in office post hospital follow up appointment. RNCM contacted patient's daughter/ designated party release, Brandy Wyatt.  HIPAA verified.   Daughter reports patient is receiving Hospice services.  Primary care provider notified that patient is receiving care from Hospice services at this time and therefore would not qualify for Chronic care management services.   Quinn Plowman RN,BSN,CCM RN Case Manager [place] [phone]

## 2020-10-15 NOTE — Progress Notes (Signed)
This encounter was created in error - please disregard.

## 2020-10-20 DIAGNOSIS — G301 Alzheimer's disease with late onset: Secondary | ICD-10-CM | POA: Diagnosis not present

## 2020-10-20 DIAGNOSIS — Z681 Body mass index (BMI) 19 or less, adult: Secondary | ICD-10-CM | POA: Diagnosis not present

## 2020-10-20 DIAGNOSIS — R634 Abnormal weight loss: Secondary | ICD-10-CM | POA: Diagnosis not present

## 2020-10-20 DIAGNOSIS — F0281 Dementia in other diseases classified elsewhere with behavioral disturbance: Secondary | ICD-10-CM | POA: Diagnosis not present

## 2020-10-20 DIAGNOSIS — E46 Unspecified protein-calorie malnutrition: Secondary | ICD-10-CM | POA: Diagnosis not present

## 2020-10-20 DIAGNOSIS — Z8616 Personal history of COVID-19: Secondary | ICD-10-CM | POA: Diagnosis not present

## 2020-10-22 DIAGNOSIS — Z681 Body mass index (BMI) 19 or less, adult: Secondary | ICD-10-CM | POA: Diagnosis not present

## 2020-10-22 DIAGNOSIS — R634 Abnormal weight loss: Secondary | ICD-10-CM | POA: Diagnosis not present

## 2020-10-22 DIAGNOSIS — G301 Alzheimer's disease with late onset: Secondary | ICD-10-CM | POA: Diagnosis not present

## 2020-10-22 DIAGNOSIS — Z8616 Personal history of COVID-19: Secondary | ICD-10-CM | POA: Diagnosis not present

## 2020-10-22 DIAGNOSIS — E46 Unspecified protein-calorie malnutrition: Secondary | ICD-10-CM | POA: Diagnosis not present

## 2020-10-22 DIAGNOSIS — F0281 Dementia in other diseases classified elsewhere with behavioral disturbance: Secondary | ICD-10-CM | POA: Diagnosis not present

## 2020-10-27 DIAGNOSIS — Z681 Body mass index (BMI) 19 or less, adult: Secondary | ICD-10-CM | POA: Diagnosis not present

## 2020-10-27 DIAGNOSIS — F0281 Dementia in other diseases classified elsewhere with behavioral disturbance: Secondary | ICD-10-CM | POA: Diagnosis not present

## 2020-10-27 DIAGNOSIS — R634 Abnormal weight loss: Secondary | ICD-10-CM | POA: Diagnosis not present

## 2020-10-27 DIAGNOSIS — Z8616 Personal history of COVID-19: Secondary | ICD-10-CM | POA: Diagnosis not present

## 2020-10-27 DIAGNOSIS — G301 Alzheimer's disease with late onset: Secondary | ICD-10-CM | POA: Diagnosis not present

## 2020-10-27 DIAGNOSIS — E46 Unspecified protein-calorie malnutrition: Secondary | ICD-10-CM | POA: Diagnosis not present

## 2020-10-29 DIAGNOSIS — G301 Alzheimer's disease with late onset: Secondary | ICD-10-CM | POA: Diagnosis not present

## 2020-10-29 DIAGNOSIS — Z8616 Personal history of COVID-19: Secondary | ICD-10-CM | POA: Diagnosis not present

## 2020-10-29 DIAGNOSIS — E46 Unspecified protein-calorie malnutrition: Secondary | ICD-10-CM | POA: Diagnosis not present

## 2020-10-29 DIAGNOSIS — Z681 Body mass index (BMI) 19 or less, adult: Secondary | ICD-10-CM | POA: Diagnosis not present

## 2020-10-29 DIAGNOSIS — F0281 Dementia in other diseases classified elsewhere with behavioral disturbance: Secondary | ICD-10-CM | POA: Diagnosis not present

## 2020-10-29 DIAGNOSIS — R634 Abnormal weight loss: Secondary | ICD-10-CM | POA: Diagnosis not present

## 2020-10-31 DIAGNOSIS — F0281 Dementia in other diseases classified elsewhere with behavioral disturbance: Secondary | ICD-10-CM | POA: Diagnosis not present

## 2020-10-31 DIAGNOSIS — E46 Unspecified protein-calorie malnutrition: Secondary | ICD-10-CM | POA: Diagnosis not present

## 2020-10-31 DIAGNOSIS — Z8616 Personal history of COVID-19: Secondary | ICD-10-CM | POA: Diagnosis not present

## 2020-10-31 DIAGNOSIS — Z681 Body mass index (BMI) 19 or less, adult: Secondary | ICD-10-CM | POA: Diagnosis not present

## 2020-10-31 DIAGNOSIS — R634 Abnormal weight loss: Secondary | ICD-10-CM | POA: Diagnosis not present

## 2020-10-31 DIAGNOSIS — G301 Alzheimer's disease with late onset: Secondary | ICD-10-CM | POA: Diagnosis not present

## 2020-11-02 DIAGNOSIS — F0281 Dementia in other diseases classified elsewhere with behavioral disturbance: Secondary | ICD-10-CM | POA: Diagnosis not present

## 2020-11-02 DIAGNOSIS — R634 Abnormal weight loss: Secondary | ICD-10-CM | POA: Diagnosis not present

## 2020-11-02 DIAGNOSIS — E46 Unspecified protein-calorie malnutrition: Secondary | ICD-10-CM | POA: Diagnosis not present

## 2020-11-02 DIAGNOSIS — Z681 Body mass index (BMI) 19 or less, adult: Secondary | ICD-10-CM | POA: Diagnosis not present

## 2020-11-02 DIAGNOSIS — G301 Alzheimer's disease with late onset: Secondary | ICD-10-CM | POA: Diagnosis not present

## 2020-11-02 DIAGNOSIS — Z8616 Personal history of COVID-19: Secondary | ICD-10-CM | POA: Diagnosis not present

## 2020-11-03 DIAGNOSIS — F0281 Dementia in other diseases classified elsewhere with behavioral disturbance: Secondary | ICD-10-CM | POA: Diagnosis not present

## 2020-11-03 DIAGNOSIS — Z681 Body mass index (BMI) 19 or less, adult: Secondary | ICD-10-CM | POA: Diagnosis not present

## 2020-11-03 DIAGNOSIS — Z8616 Personal history of COVID-19: Secondary | ICD-10-CM | POA: Diagnosis not present

## 2020-11-03 DIAGNOSIS — G301 Alzheimer's disease with late onset: Secondary | ICD-10-CM | POA: Diagnosis not present

## 2020-11-03 DIAGNOSIS — R634 Abnormal weight loss: Secondary | ICD-10-CM | POA: Diagnosis not present

## 2020-11-03 DIAGNOSIS — E46 Unspecified protein-calorie malnutrition: Secondary | ICD-10-CM | POA: Diagnosis not present

## 2020-11-04 DIAGNOSIS — R634 Abnormal weight loss: Secondary | ICD-10-CM | POA: Diagnosis not present

## 2020-11-04 DIAGNOSIS — E46 Unspecified protein-calorie malnutrition: Secondary | ICD-10-CM | POA: Diagnosis not present

## 2020-11-04 DIAGNOSIS — Z681 Body mass index (BMI) 19 or less, adult: Secondary | ICD-10-CM | POA: Diagnosis not present

## 2020-11-04 DIAGNOSIS — F0281 Dementia in other diseases classified elsewhere with behavioral disturbance: Secondary | ICD-10-CM | POA: Diagnosis not present

## 2020-11-04 DIAGNOSIS — Z8616 Personal history of COVID-19: Secondary | ICD-10-CM | POA: Diagnosis not present

## 2020-11-04 DIAGNOSIS — G301 Alzheimer's disease with late onset: Secondary | ICD-10-CM | POA: Diagnosis not present

## 2020-11-07 DIAGNOSIS — F32A Depression, unspecified: Secondary | ICD-10-CM | POA: Diagnosis not present

## 2020-11-07 DIAGNOSIS — R634 Abnormal weight loss: Secondary | ICD-10-CM | POA: Diagnosis not present

## 2020-11-07 DIAGNOSIS — G301 Alzheimer's disease with late onset: Secondary | ICD-10-CM | POA: Diagnosis not present

## 2020-11-07 DIAGNOSIS — Z8616 Personal history of COVID-19: Secondary | ICD-10-CM | POA: Diagnosis not present

## 2020-11-07 DIAGNOSIS — Z853 Personal history of malignant neoplasm of breast: Secondary | ICD-10-CM | POA: Diagnosis not present

## 2020-11-07 DIAGNOSIS — R410 Disorientation, unspecified: Secondary | ICD-10-CM | POA: Diagnosis not present

## 2020-11-07 DIAGNOSIS — F0281 Dementia in other diseases classified elsewhere with behavioral disturbance: Secondary | ICD-10-CM | POA: Diagnosis not present

## 2020-11-07 DIAGNOSIS — Z681 Body mass index (BMI) 19 or less, adult: Secondary | ICD-10-CM | POA: Diagnosis not present

## 2020-11-07 DIAGNOSIS — Z8679 Personal history of other diseases of the circulatory system: Secondary | ICD-10-CM | POA: Diagnosis not present

## 2020-11-07 DIAGNOSIS — E46 Unspecified protein-calorie malnutrition: Secondary | ICD-10-CM | POA: Diagnosis not present

## 2020-11-10 DIAGNOSIS — Z681 Body mass index (BMI) 19 or less, adult: Secondary | ICD-10-CM | POA: Diagnosis not present

## 2020-11-10 DIAGNOSIS — E46 Unspecified protein-calorie malnutrition: Secondary | ICD-10-CM | POA: Diagnosis not present

## 2020-11-10 DIAGNOSIS — R634 Abnormal weight loss: Secondary | ICD-10-CM | POA: Diagnosis not present

## 2020-11-10 DIAGNOSIS — F0281 Dementia in other diseases classified elsewhere with behavioral disturbance: Secondary | ICD-10-CM | POA: Diagnosis not present

## 2020-11-10 DIAGNOSIS — Z8616 Personal history of COVID-19: Secondary | ICD-10-CM | POA: Diagnosis not present

## 2020-11-10 DIAGNOSIS — G301 Alzheimer's disease with late onset: Secondary | ICD-10-CM | POA: Diagnosis not present

## 2020-11-11 DIAGNOSIS — E46 Unspecified protein-calorie malnutrition: Secondary | ICD-10-CM | POA: Diagnosis not present

## 2020-11-11 DIAGNOSIS — Z8616 Personal history of COVID-19: Secondary | ICD-10-CM | POA: Diagnosis not present

## 2020-11-11 DIAGNOSIS — Z681 Body mass index (BMI) 19 or less, adult: Secondary | ICD-10-CM | POA: Diagnosis not present

## 2020-11-11 DIAGNOSIS — F0281 Dementia in other diseases classified elsewhere with behavioral disturbance: Secondary | ICD-10-CM | POA: Diagnosis not present

## 2020-11-11 DIAGNOSIS — G301 Alzheimer's disease with late onset: Secondary | ICD-10-CM | POA: Diagnosis not present

## 2020-11-11 DIAGNOSIS — R634 Abnormal weight loss: Secondary | ICD-10-CM | POA: Diagnosis not present

## 2020-11-17 DIAGNOSIS — R634 Abnormal weight loss: Secondary | ICD-10-CM | POA: Diagnosis not present

## 2020-11-17 DIAGNOSIS — E46 Unspecified protein-calorie malnutrition: Secondary | ICD-10-CM | POA: Diagnosis not present

## 2020-11-17 DIAGNOSIS — G301 Alzheimer's disease with late onset: Secondary | ICD-10-CM | POA: Diagnosis not present

## 2020-11-17 DIAGNOSIS — F0281 Dementia in other diseases classified elsewhere with behavioral disturbance: Secondary | ICD-10-CM | POA: Diagnosis not present

## 2020-11-17 DIAGNOSIS — Z681 Body mass index (BMI) 19 or less, adult: Secondary | ICD-10-CM | POA: Diagnosis not present

## 2020-11-17 DIAGNOSIS — Z8616 Personal history of COVID-19: Secondary | ICD-10-CM | POA: Diagnosis not present

## 2020-11-19 DIAGNOSIS — G301 Alzheimer's disease with late onset: Secondary | ICD-10-CM | POA: Diagnosis not present

## 2020-11-19 DIAGNOSIS — R634 Abnormal weight loss: Secondary | ICD-10-CM | POA: Diagnosis not present

## 2020-11-19 DIAGNOSIS — F0281 Dementia in other diseases classified elsewhere with behavioral disturbance: Secondary | ICD-10-CM | POA: Diagnosis not present

## 2020-11-19 DIAGNOSIS — Z8616 Personal history of COVID-19: Secondary | ICD-10-CM | POA: Diagnosis not present

## 2020-11-19 DIAGNOSIS — Z681 Body mass index (BMI) 19 or less, adult: Secondary | ICD-10-CM | POA: Diagnosis not present

## 2020-11-19 DIAGNOSIS — E46 Unspecified protein-calorie malnutrition: Secondary | ICD-10-CM | POA: Diagnosis not present

## 2020-11-21 DIAGNOSIS — Z681 Body mass index (BMI) 19 or less, adult: Secondary | ICD-10-CM | POA: Diagnosis not present

## 2020-11-21 DIAGNOSIS — R634 Abnormal weight loss: Secondary | ICD-10-CM | POA: Diagnosis not present

## 2020-11-21 DIAGNOSIS — Z8616 Personal history of COVID-19: Secondary | ICD-10-CM | POA: Diagnosis not present

## 2020-11-21 DIAGNOSIS — G301 Alzheimer's disease with late onset: Secondary | ICD-10-CM | POA: Diagnosis not present

## 2020-11-21 DIAGNOSIS — E46 Unspecified protein-calorie malnutrition: Secondary | ICD-10-CM | POA: Diagnosis not present

## 2020-11-21 DIAGNOSIS — F0281 Dementia in other diseases classified elsewhere with behavioral disturbance: Secondary | ICD-10-CM | POA: Diagnosis not present

## 2020-11-24 DIAGNOSIS — Z681 Body mass index (BMI) 19 or less, adult: Secondary | ICD-10-CM | POA: Diagnosis not present

## 2020-11-24 DIAGNOSIS — R634 Abnormal weight loss: Secondary | ICD-10-CM | POA: Diagnosis not present

## 2020-11-24 DIAGNOSIS — Z8616 Personal history of COVID-19: Secondary | ICD-10-CM | POA: Diagnosis not present

## 2020-11-24 DIAGNOSIS — E46 Unspecified protein-calorie malnutrition: Secondary | ICD-10-CM | POA: Diagnosis not present

## 2020-11-24 DIAGNOSIS — G301 Alzheimer's disease with late onset: Secondary | ICD-10-CM | POA: Diagnosis not present

## 2020-11-24 DIAGNOSIS — F0281 Dementia in other diseases classified elsewhere with behavioral disturbance: Secondary | ICD-10-CM | POA: Diagnosis not present

## 2020-11-26 DIAGNOSIS — Z681 Body mass index (BMI) 19 or less, adult: Secondary | ICD-10-CM | POA: Diagnosis not present

## 2020-11-26 DIAGNOSIS — E46 Unspecified protein-calorie malnutrition: Secondary | ICD-10-CM | POA: Diagnosis not present

## 2020-11-26 DIAGNOSIS — Z8616 Personal history of COVID-19: Secondary | ICD-10-CM | POA: Diagnosis not present

## 2020-11-26 DIAGNOSIS — R634 Abnormal weight loss: Secondary | ICD-10-CM | POA: Diagnosis not present

## 2020-11-26 DIAGNOSIS — F0281 Dementia in other diseases classified elsewhere with behavioral disturbance: Secondary | ICD-10-CM | POA: Diagnosis not present

## 2020-11-26 DIAGNOSIS — G301 Alzheimer's disease with late onset: Secondary | ICD-10-CM | POA: Diagnosis not present

## 2020-12-01 DIAGNOSIS — R634 Abnormal weight loss: Secondary | ICD-10-CM | POA: Diagnosis not present

## 2020-12-01 DIAGNOSIS — Z8616 Personal history of COVID-19: Secondary | ICD-10-CM | POA: Diagnosis not present

## 2020-12-01 DIAGNOSIS — Z681 Body mass index (BMI) 19 or less, adult: Secondary | ICD-10-CM | POA: Diagnosis not present

## 2020-12-01 DIAGNOSIS — E46 Unspecified protein-calorie malnutrition: Secondary | ICD-10-CM | POA: Diagnosis not present

## 2020-12-01 DIAGNOSIS — F0281 Dementia in other diseases classified elsewhere with behavioral disturbance: Secondary | ICD-10-CM | POA: Diagnosis not present

## 2020-12-01 DIAGNOSIS — G301 Alzheimer's disease with late onset: Secondary | ICD-10-CM | POA: Diagnosis not present

## 2020-12-03 DIAGNOSIS — Z8616 Personal history of COVID-19: Secondary | ICD-10-CM | POA: Diagnosis not present

## 2020-12-03 DIAGNOSIS — G301 Alzheimer's disease with late onset: Secondary | ICD-10-CM | POA: Diagnosis not present

## 2020-12-03 DIAGNOSIS — F0281 Dementia in other diseases classified elsewhere with behavioral disturbance: Secondary | ICD-10-CM | POA: Diagnosis not present

## 2020-12-03 DIAGNOSIS — Z681 Body mass index (BMI) 19 or less, adult: Secondary | ICD-10-CM | POA: Diagnosis not present

## 2020-12-03 DIAGNOSIS — R634 Abnormal weight loss: Secondary | ICD-10-CM | POA: Diagnosis not present

## 2020-12-03 DIAGNOSIS — E46 Unspecified protein-calorie malnutrition: Secondary | ICD-10-CM | POA: Diagnosis not present

## 2020-12-07 DIAGNOSIS — R410 Disorientation, unspecified: Secondary | ICD-10-CM | POA: Diagnosis not present

## 2020-12-07 DIAGNOSIS — F0281 Dementia in other diseases classified elsewhere with behavioral disturbance: Secondary | ICD-10-CM | POA: Diagnosis not present

## 2020-12-07 DIAGNOSIS — R634 Abnormal weight loss: Secondary | ICD-10-CM | POA: Diagnosis not present

## 2020-12-07 DIAGNOSIS — G301 Alzheimer's disease with late onset: Secondary | ICD-10-CM | POA: Diagnosis not present

## 2020-12-07 DIAGNOSIS — Z8679 Personal history of other diseases of the circulatory system: Secondary | ICD-10-CM | POA: Diagnosis not present

## 2020-12-07 DIAGNOSIS — Z853 Personal history of malignant neoplasm of breast: Secondary | ICD-10-CM | POA: Diagnosis not present

## 2020-12-07 DIAGNOSIS — E46 Unspecified protein-calorie malnutrition: Secondary | ICD-10-CM | POA: Diagnosis not present

## 2020-12-07 DIAGNOSIS — F32A Depression, unspecified: Secondary | ICD-10-CM | POA: Diagnosis not present

## 2020-12-07 DIAGNOSIS — E43 Unspecified severe protein-calorie malnutrition: Secondary | ICD-10-CM | POA: Diagnosis not present

## 2020-12-07 DIAGNOSIS — Z681 Body mass index (BMI) 19 or less, adult: Secondary | ICD-10-CM | POA: Diagnosis not present

## 2020-12-07 DIAGNOSIS — Z8616 Personal history of COVID-19: Secondary | ICD-10-CM | POA: Diagnosis not present

## 2020-12-08 DIAGNOSIS — E46 Unspecified protein-calorie malnutrition: Secondary | ICD-10-CM | POA: Diagnosis not present

## 2020-12-08 DIAGNOSIS — R634 Abnormal weight loss: Secondary | ICD-10-CM | POA: Diagnosis not present

## 2020-12-08 DIAGNOSIS — F0281 Dementia in other diseases classified elsewhere with behavioral disturbance: Secondary | ICD-10-CM | POA: Diagnosis not present

## 2020-12-08 DIAGNOSIS — G301 Alzheimer's disease with late onset: Secondary | ICD-10-CM | POA: Diagnosis not present

## 2020-12-08 DIAGNOSIS — Z681 Body mass index (BMI) 19 or less, adult: Secondary | ICD-10-CM | POA: Diagnosis not present

## 2020-12-08 DIAGNOSIS — E43 Unspecified severe protein-calorie malnutrition: Secondary | ICD-10-CM | POA: Diagnosis not present

## 2020-12-10 DIAGNOSIS — E46 Unspecified protein-calorie malnutrition: Secondary | ICD-10-CM | POA: Diagnosis not present

## 2020-12-10 DIAGNOSIS — G301 Alzheimer's disease with late onset: Secondary | ICD-10-CM | POA: Diagnosis not present

## 2020-12-10 DIAGNOSIS — R634 Abnormal weight loss: Secondary | ICD-10-CM | POA: Diagnosis not present

## 2020-12-10 DIAGNOSIS — Z681 Body mass index (BMI) 19 or less, adult: Secondary | ICD-10-CM | POA: Diagnosis not present

## 2020-12-10 DIAGNOSIS — E43 Unspecified severe protein-calorie malnutrition: Secondary | ICD-10-CM | POA: Diagnosis not present

## 2020-12-10 DIAGNOSIS — F0281 Dementia in other diseases classified elsewhere with behavioral disturbance: Secondary | ICD-10-CM | POA: Diagnosis not present

## 2020-12-15 DIAGNOSIS — E43 Unspecified severe protein-calorie malnutrition: Secondary | ICD-10-CM | POA: Diagnosis not present

## 2020-12-15 DIAGNOSIS — E46 Unspecified protein-calorie malnutrition: Secondary | ICD-10-CM | POA: Diagnosis not present

## 2020-12-15 DIAGNOSIS — Z681 Body mass index (BMI) 19 or less, adult: Secondary | ICD-10-CM | POA: Diagnosis not present

## 2020-12-15 DIAGNOSIS — R634 Abnormal weight loss: Secondary | ICD-10-CM | POA: Diagnosis not present

## 2020-12-15 DIAGNOSIS — G301 Alzheimer's disease with late onset: Secondary | ICD-10-CM | POA: Diagnosis not present

## 2020-12-15 DIAGNOSIS — F0281 Dementia in other diseases classified elsewhere with behavioral disturbance: Secondary | ICD-10-CM | POA: Diagnosis not present

## 2020-12-16 DIAGNOSIS — Z681 Body mass index (BMI) 19 or less, adult: Secondary | ICD-10-CM | POA: Diagnosis not present

## 2020-12-16 DIAGNOSIS — E43 Unspecified severe protein-calorie malnutrition: Secondary | ICD-10-CM | POA: Diagnosis not present

## 2020-12-16 DIAGNOSIS — G301 Alzheimer's disease with late onset: Secondary | ICD-10-CM | POA: Diagnosis not present

## 2020-12-16 DIAGNOSIS — E46 Unspecified protein-calorie malnutrition: Secondary | ICD-10-CM | POA: Diagnosis not present

## 2020-12-16 DIAGNOSIS — R634 Abnormal weight loss: Secondary | ICD-10-CM | POA: Diagnosis not present

## 2020-12-16 DIAGNOSIS — F0281 Dementia in other diseases classified elsewhere with behavioral disturbance: Secondary | ICD-10-CM | POA: Diagnosis not present

## 2020-12-17 DIAGNOSIS — R634 Abnormal weight loss: Secondary | ICD-10-CM | POA: Diagnosis not present

## 2020-12-17 DIAGNOSIS — G301 Alzheimer's disease with late onset: Secondary | ICD-10-CM | POA: Diagnosis not present

## 2020-12-17 DIAGNOSIS — E46 Unspecified protein-calorie malnutrition: Secondary | ICD-10-CM | POA: Diagnosis not present

## 2020-12-17 DIAGNOSIS — E43 Unspecified severe protein-calorie malnutrition: Secondary | ICD-10-CM | POA: Diagnosis not present

## 2020-12-17 DIAGNOSIS — F0281 Dementia in other diseases classified elsewhere with behavioral disturbance: Secondary | ICD-10-CM | POA: Diagnosis not present

## 2020-12-17 DIAGNOSIS — Z681 Body mass index (BMI) 19 or less, adult: Secondary | ICD-10-CM | POA: Diagnosis not present

## 2020-12-22 DIAGNOSIS — G301 Alzheimer's disease with late onset: Secondary | ICD-10-CM | POA: Diagnosis not present

## 2020-12-22 DIAGNOSIS — Z681 Body mass index (BMI) 19 or less, adult: Secondary | ICD-10-CM | POA: Diagnosis not present

## 2020-12-22 DIAGNOSIS — E43 Unspecified severe protein-calorie malnutrition: Secondary | ICD-10-CM | POA: Diagnosis not present

## 2020-12-22 DIAGNOSIS — F0281 Dementia in other diseases classified elsewhere with behavioral disturbance: Secondary | ICD-10-CM | POA: Diagnosis not present

## 2020-12-22 DIAGNOSIS — E46 Unspecified protein-calorie malnutrition: Secondary | ICD-10-CM | POA: Diagnosis not present

## 2020-12-22 DIAGNOSIS — R634 Abnormal weight loss: Secondary | ICD-10-CM | POA: Diagnosis not present

## 2020-12-24 DIAGNOSIS — E46 Unspecified protein-calorie malnutrition: Secondary | ICD-10-CM | POA: Diagnosis not present

## 2020-12-24 DIAGNOSIS — Z681 Body mass index (BMI) 19 or less, adult: Secondary | ICD-10-CM | POA: Diagnosis not present

## 2020-12-24 DIAGNOSIS — E43 Unspecified severe protein-calorie malnutrition: Secondary | ICD-10-CM | POA: Diagnosis not present

## 2020-12-24 DIAGNOSIS — R634 Abnormal weight loss: Secondary | ICD-10-CM | POA: Diagnosis not present

## 2020-12-24 DIAGNOSIS — F0281 Dementia in other diseases classified elsewhere with behavioral disturbance: Secondary | ICD-10-CM | POA: Diagnosis not present

## 2020-12-24 DIAGNOSIS — G301 Alzheimer's disease with late onset: Secondary | ICD-10-CM | POA: Diagnosis not present

## 2020-12-29 DIAGNOSIS — Z681 Body mass index (BMI) 19 or less, adult: Secondary | ICD-10-CM | POA: Diagnosis not present

## 2020-12-29 DIAGNOSIS — F0281 Dementia in other diseases classified elsewhere with behavioral disturbance: Secondary | ICD-10-CM | POA: Diagnosis not present

## 2020-12-29 DIAGNOSIS — E46 Unspecified protein-calorie malnutrition: Secondary | ICD-10-CM | POA: Diagnosis not present

## 2020-12-29 DIAGNOSIS — E43 Unspecified severe protein-calorie malnutrition: Secondary | ICD-10-CM | POA: Diagnosis not present

## 2020-12-29 DIAGNOSIS — R634 Abnormal weight loss: Secondary | ICD-10-CM | POA: Diagnosis not present

## 2020-12-29 DIAGNOSIS — G301 Alzheimer's disease with late onset: Secondary | ICD-10-CM | POA: Diagnosis not present

## 2020-12-30 DIAGNOSIS — F0281 Dementia in other diseases classified elsewhere with behavioral disturbance: Secondary | ICD-10-CM | POA: Diagnosis not present

## 2020-12-30 DIAGNOSIS — G301 Alzheimer's disease with late onset: Secondary | ICD-10-CM | POA: Diagnosis not present

## 2020-12-30 DIAGNOSIS — E43 Unspecified severe protein-calorie malnutrition: Secondary | ICD-10-CM | POA: Diagnosis not present

## 2020-12-30 DIAGNOSIS — R634 Abnormal weight loss: Secondary | ICD-10-CM | POA: Diagnosis not present

## 2020-12-30 DIAGNOSIS — E46 Unspecified protein-calorie malnutrition: Secondary | ICD-10-CM | POA: Diagnosis not present

## 2020-12-30 DIAGNOSIS — Z681 Body mass index (BMI) 19 or less, adult: Secondary | ICD-10-CM | POA: Diagnosis not present

## 2021-01-05 DIAGNOSIS — G301 Alzheimer's disease with late onset: Secondary | ICD-10-CM | POA: Diagnosis not present

## 2021-01-05 DIAGNOSIS — Z681 Body mass index (BMI) 19 or less, adult: Secondary | ICD-10-CM | POA: Diagnosis not present

## 2021-01-05 DIAGNOSIS — R634 Abnormal weight loss: Secondary | ICD-10-CM | POA: Diagnosis not present

## 2021-01-05 DIAGNOSIS — E46 Unspecified protein-calorie malnutrition: Secondary | ICD-10-CM | POA: Diagnosis not present

## 2021-01-05 DIAGNOSIS — F0281 Dementia in other diseases classified elsewhere with behavioral disturbance: Secondary | ICD-10-CM | POA: Diagnosis not present

## 2021-01-05 DIAGNOSIS — E43 Unspecified severe protein-calorie malnutrition: Secondary | ICD-10-CM | POA: Diagnosis not present

## 2021-01-06 DIAGNOSIS — E43 Unspecified severe protein-calorie malnutrition: Secondary | ICD-10-CM | POA: Diagnosis not present

## 2021-01-06 DIAGNOSIS — E46 Unspecified protein-calorie malnutrition: Secondary | ICD-10-CM | POA: Diagnosis not present

## 2021-01-06 DIAGNOSIS — G301 Alzheimer's disease with late onset: Secondary | ICD-10-CM | POA: Diagnosis not present

## 2021-01-06 DIAGNOSIS — Z681 Body mass index (BMI) 19 or less, adult: Secondary | ICD-10-CM | POA: Diagnosis not present

## 2021-01-06 DIAGNOSIS — R634 Abnormal weight loss: Secondary | ICD-10-CM | POA: Diagnosis not present

## 2021-01-06 DIAGNOSIS — F0281 Dementia in other diseases classified elsewhere with behavioral disturbance: Secondary | ICD-10-CM | POA: Diagnosis not present

## 2021-01-07 DIAGNOSIS — G301 Alzheimer's disease with late onset: Secondary | ICD-10-CM | POA: Diagnosis not present

## 2021-01-07 DIAGNOSIS — F32A Depression, unspecified: Secondary | ICD-10-CM | POA: Diagnosis not present

## 2021-01-07 DIAGNOSIS — R634 Abnormal weight loss: Secondary | ICD-10-CM | POA: Diagnosis not present

## 2021-01-07 DIAGNOSIS — Z681 Body mass index (BMI) 19 or less, adult: Secondary | ICD-10-CM | POA: Diagnosis not present

## 2021-01-07 DIAGNOSIS — Z8679 Personal history of other diseases of the circulatory system: Secondary | ICD-10-CM | POA: Diagnosis not present

## 2021-01-07 DIAGNOSIS — Z853 Personal history of malignant neoplasm of breast: Secondary | ICD-10-CM | POA: Diagnosis not present

## 2021-01-07 DIAGNOSIS — F0281 Dementia in other diseases classified elsewhere with behavioral disturbance: Secondary | ICD-10-CM | POA: Diagnosis not present

## 2021-01-07 DIAGNOSIS — E43 Unspecified severe protein-calorie malnutrition: Secondary | ICD-10-CM | POA: Diagnosis not present

## 2021-01-07 DIAGNOSIS — Z8616 Personal history of COVID-19: Secondary | ICD-10-CM | POA: Diagnosis not present

## 2021-01-07 DIAGNOSIS — R410 Disorientation, unspecified: Secondary | ICD-10-CM | POA: Diagnosis not present

## 2021-01-12 DIAGNOSIS — G301 Alzheimer's disease with late onset: Secondary | ICD-10-CM | POA: Diagnosis not present

## 2021-01-12 DIAGNOSIS — Z681 Body mass index (BMI) 19 or less, adult: Secondary | ICD-10-CM | POA: Diagnosis not present

## 2021-01-12 DIAGNOSIS — R634 Abnormal weight loss: Secondary | ICD-10-CM | POA: Diagnosis not present

## 2021-01-12 DIAGNOSIS — Z8616 Personal history of COVID-19: Secondary | ICD-10-CM | POA: Diagnosis not present

## 2021-01-12 DIAGNOSIS — E43 Unspecified severe protein-calorie malnutrition: Secondary | ICD-10-CM | POA: Diagnosis not present

## 2021-01-12 DIAGNOSIS — F0281 Dementia in other diseases classified elsewhere with behavioral disturbance: Secondary | ICD-10-CM | POA: Diagnosis not present

## 2021-01-13 DIAGNOSIS — Z8616 Personal history of COVID-19: Secondary | ICD-10-CM | POA: Diagnosis not present

## 2021-01-13 DIAGNOSIS — Z681 Body mass index (BMI) 19 or less, adult: Secondary | ICD-10-CM | POA: Diagnosis not present

## 2021-01-13 DIAGNOSIS — G301 Alzheimer's disease with late onset: Secondary | ICD-10-CM | POA: Diagnosis not present

## 2021-01-13 DIAGNOSIS — R634 Abnormal weight loss: Secondary | ICD-10-CM | POA: Diagnosis not present

## 2021-01-13 DIAGNOSIS — F0281 Dementia in other diseases classified elsewhere with behavioral disturbance: Secondary | ICD-10-CM | POA: Diagnosis not present

## 2021-01-13 DIAGNOSIS — E43 Unspecified severe protein-calorie malnutrition: Secondary | ICD-10-CM | POA: Diagnosis not present

## 2021-01-17 DIAGNOSIS — F0281 Dementia in other diseases classified elsewhere with behavioral disturbance: Secondary | ICD-10-CM | POA: Diagnosis not present

## 2021-01-17 DIAGNOSIS — G301 Alzheimer's disease with late onset: Secondary | ICD-10-CM | POA: Diagnosis not present

## 2021-01-17 DIAGNOSIS — Z8616 Personal history of COVID-19: Secondary | ICD-10-CM | POA: Diagnosis not present

## 2021-01-17 DIAGNOSIS — R634 Abnormal weight loss: Secondary | ICD-10-CM | POA: Diagnosis not present

## 2021-01-17 DIAGNOSIS — E43 Unspecified severe protein-calorie malnutrition: Secondary | ICD-10-CM | POA: Diagnosis not present

## 2021-01-17 DIAGNOSIS — Z681 Body mass index (BMI) 19 or less, adult: Secondary | ICD-10-CM | POA: Diagnosis not present

## 2021-01-19 DIAGNOSIS — F0281 Dementia in other diseases classified elsewhere with behavioral disturbance: Secondary | ICD-10-CM | POA: Diagnosis not present

## 2021-01-19 DIAGNOSIS — E43 Unspecified severe protein-calorie malnutrition: Secondary | ICD-10-CM | POA: Diagnosis not present

## 2021-01-19 DIAGNOSIS — Z681 Body mass index (BMI) 19 or less, adult: Secondary | ICD-10-CM | POA: Diagnosis not present

## 2021-01-19 DIAGNOSIS — R634 Abnormal weight loss: Secondary | ICD-10-CM | POA: Diagnosis not present

## 2021-01-19 DIAGNOSIS — Z8616 Personal history of COVID-19: Secondary | ICD-10-CM | POA: Diagnosis not present

## 2021-01-19 DIAGNOSIS — G301 Alzheimer's disease with late onset: Secondary | ICD-10-CM | POA: Diagnosis not present

## 2021-01-20 DIAGNOSIS — Z681 Body mass index (BMI) 19 or less, adult: Secondary | ICD-10-CM | POA: Diagnosis not present

## 2021-01-20 DIAGNOSIS — Z8616 Personal history of COVID-19: Secondary | ICD-10-CM | POA: Diagnosis not present

## 2021-01-20 DIAGNOSIS — R634 Abnormal weight loss: Secondary | ICD-10-CM | POA: Diagnosis not present

## 2021-01-20 DIAGNOSIS — F0281 Dementia in other diseases classified elsewhere with behavioral disturbance: Secondary | ICD-10-CM | POA: Diagnosis not present

## 2021-01-20 DIAGNOSIS — E43 Unspecified severe protein-calorie malnutrition: Secondary | ICD-10-CM | POA: Diagnosis not present

## 2021-01-20 DIAGNOSIS — G301 Alzheimer's disease with late onset: Secondary | ICD-10-CM | POA: Diagnosis not present

## 2021-01-26 DIAGNOSIS — Z8616 Personal history of COVID-19: Secondary | ICD-10-CM | POA: Diagnosis not present

## 2021-01-26 DIAGNOSIS — R634 Abnormal weight loss: Secondary | ICD-10-CM | POA: Diagnosis not present

## 2021-01-26 DIAGNOSIS — F0281 Dementia in other diseases classified elsewhere with behavioral disturbance: Secondary | ICD-10-CM | POA: Diagnosis not present

## 2021-01-26 DIAGNOSIS — E43 Unspecified severe protein-calorie malnutrition: Secondary | ICD-10-CM | POA: Diagnosis not present

## 2021-01-26 DIAGNOSIS — Z681 Body mass index (BMI) 19 or less, adult: Secondary | ICD-10-CM | POA: Diagnosis not present

## 2021-01-26 DIAGNOSIS — G301 Alzheimer's disease with late onset: Secondary | ICD-10-CM | POA: Diagnosis not present

## 2021-01-27 DIAGNOSIS — E43 Unspecified severe protein-calorie malnutrition: Secondary | ICD-10-CM | POA: Diagnosis not present

## 2021-01-27 DIAGNOSIS — F0281 Dementia in other diseases classified elsewhere with behavioral disturbance: Secondary | ICD-10-CM | POA: Diagnosis not present

## 2021-01-27 DIAGNOSIS — Z8616 Personal history of COVID-19: Secondary | ICD-10-CM | POA: Diagnosis not present

## 2021-01-27 DIAGNOSIS — G301 Alzheimer's disease with late onset: Secondary | ICD-10-CM | POA: Diagnosis not present

## 2021-01-27 DIAGNOSIS — Z681 Body mass index (BMI) 19 or less, adult: Secondary | ICD-10-CM | POA: Diagnosis not present

## 2021-01-27 DIAGNOSIS — R634 Abnormal weight loss: Secondary | ICD-10-CM | POA: Diagnosis not present

## 2021-01-29 DIAGNOSIS — E43 Unspecified severe protein-calorie malnutrition: Secondary | ICD-10-CM | POA: Diagnosis not present

## 2021-01-29 DIAGNOSIS — R634 Abnormal weight loss: Secondary | ICD-10-CM | POA: Diagnosis not present

## 2021-01-29 DIAGNOSIS — G301 Alzheimer's disease with late onset: Secondary | ICD-10-CM | POA: Diagnosis not present

## 2021-01-29 DIAGNOSIS — F0281 Dementia in other diseases classified elsewhere with behavioral disturbance: Secondary | ICD-10-CM | POA: Diagnosis not present

## 2021-01-29 DIAGNOSIS — Z8616 Personal history of COVID-19: Secondary | ICD-10-CM | POA: Diagnosis not present

## 2021-01-29 DIAGNOSIS — Z681 Body mass index (BMI) 19 or less, adult: Secondary | ICD-10-CM | POA: Diagnosis not present

## 2021-02-02 DIAGNOSIS — F0281 Dementia in other diseases classified elsewhere with behavioral disturbance: Secondary | ICD-10-CM | POA: Diagnosis not present

## 2021-02-02 DIAGNOSIS — E43 Unspecified severe protein-calorie malnutrition: Secondary | ICD-10-CM | POA: Diagnosis not present

## 2021-02-02 DIAGNOSIS — G301 Alzheimer's disease with late onset: Secondary | ICD-10-CM | POA: Diagnosis not present

## 2021-02-02 DIAGNOSIS — R634 Abnormal weight loss: Secondary | ICD-10-CM | POA: Diagnosis not present

## 2021-02-02 DIAGNOSIS — Z8616 Personal history of COVID-19: Secondary | ICD-10-CM | POA: Diagnosis not present

## 2021-02-02 DIAGNOSIS — Z681 Body mass index (BMI) 19 or less, adult: Secondary | ICD-10-CM | POA: Diagnosis not present

## 2021-02-03 DIAGNOSIS — F0281 Dementia in other diseases classified elsewhere with behavioral disturbance: Secondary | ICD-10-CM | POA: Diagnosis not present

## 2021-02-03 DIAGNOSIS — Z681 Body mass index (BMI) 19 or less, adult: Secondary | ICD-10-CM | POA: Diagnosis not present

## 2021-02-03 DIAGNOSIS — R634 Abnormal weight loss: Secondary | ICD-10-CM | POA: Diagnosis not present

## 2021-02-03 DIAGNOSIS — Z8616 Personal history of COVID-19: Secondary | ICD-10-CM | POA: Diagnosis not present

## 2021-02-03 DIAGNOSIS — G301 Alzheimer's disease with late onset: Secondary | ICD-10-CM | POA: Diagnosis not present

## 2021-02-03 DIAGNOSIS — E43 Unspecified severe protein-calorie malnutrition: Secondary | ICD-10-CM | POA: Diagnosis not present

## 2021-02-06 DIAGNOSIS — F32A Depression, unspecified: Secondary | ICD-10-CM | POA: Diagnosis not present

## 2021-02-06 DIAGNOSIS — G301 Alzheimer's disease with late onset: Secondary | ICD-10-CM | POA: Diagnosis not present

## 2021-02-06 DIAGNOSIS — Z8616 Personal history of COVID-19: Secondary | ICD-10-CM | POA: Diagnosis not present

## 2021-02-06 DIAGNOSIS — F0281 Dementia in other diseases classified elsewhere with behavioral disturbance: Secondary | ICD-10-CM | POA: Diagnosis not present

## 2021-02-06 DIAGNOSIS — Z8679 Personal history of other diseases of the circulatory system: Secondary | ICD-10-CM | POA: Diagnosis not present

## 2021-02-06 DIAGNOSIS — R410 Disorientation, unspecified: Secondary | ICD-10-CM | POA: Diagnosis not present

## 2021-02-06 DIAGNOSIS — R634 Abnormal weight loss: Secondary | ICD-10-CM | POA: Diagnosis not present

## 2021-02-06 DIAGNOSIS — E43 Unspecified severe protein-calorie malnutrition: Secondary | ICD-10-CM | POA: Diagnosis not present

## 2021-02-06 DIAGNOSIS — Z681 Body mass index (BMI) 19 or less, adult: Secondary | ICD-10-CM | POA: Diagnosis not present

## 2021-02-06 DIAGNOSIS — Z853 Personal history of malignant neoplasm of breast: Secondary | ICD-10-CM | POA: Diagnosis not present

## 2021-02-10 DIAGNOSIS — R634 Abnormal weight loss: Secondary | ICD-10-CM | POA: Diagnosis not present

## 2021-02-10 DIAGNOSIS — Z8616 Personal history of COVID-19: Secondary | ICD-10-CM | POA: Diagnosis not present

## 2021-02-10 DIAGNOSIS — E43 Unspecified severe protein-calorie malnutrition: Secondary | ICD-10-CM | POA: Diagnosis not present

## 2021-02-10 DIAGNOSIS — F0281 Dementia in other diseases classified elsewhere with behavioral disturbance: Secondary | ICD-10-CM | POA: Diagnosis not present

## 2021-02-10 DIAGNOSIS — G301 Alzheimer's disease with late onset: Secondary | ICD-10-CM | POA: Diagnosis not present

## 2021-02-10 DIAGNOSIS — Z681 Body mass index (BMI) 19 or less, adult: Secondary | ICD-10-CM | POA: Diagnosis not present

## 2021-02-11 DIAGNOSIS — Z681 Body mass index (BMI) 19 or less, adult: Secondary | ICD-10-CM | POA: Diagnosis not present

## 2021-02-11 DIAGNOSIS — G301 Alzheimer's disease with late onset: Secondary | ICD-10-CM | POA: Diagnosis not present

## 2021-02-11 DIAGNOSIS — R634 Abnormal weight loss: Secondary | ICD-10-CM | POA: Diagnosis not present

## 2021-02-11 DIAGNOSIS — F0281 Dementia in other diseases classified elsewhere with behavioral disturbance: Secondary | ICD-10-CM | POA: Diagnosis not present

## 2021-02-11 DIAGNOSIS — E43 Unspecified severe protein-calorie malnutrition: Secondary | ICD-10-CM | POA: Diagnosis not present

## 2021-02-11 DIAGNOSIS — Z8616 Personal history of COVID-19: Secondary | ICD-10-CM | POA: Diagnosis not present

## 2021-02-16 DIAGNOSIS — Z8616 Personal history of COVID-19: Secondary | ICD-10-CM | POA: Diagnosis not present

## 2021-02-16 DIAGNOSIS — F0281 Dementia in other diseases classified elsewhere with behavioral disturbance: Secondary | ICD-10-CM | POA: Diagnosis not present

## 2021-02-16 DIAGNOSIS — G301 Alzheimer's disease with late onset: Secondary | ICD-10-CM | POA: Diagnosis not present

## 2021-02-16 DIAGNOSIS — E43 Unspecified severe protein-calorie malnutrition: Secondary | ICD-10-CM | POA: Diagnosis not present

## 2021-02-16 DIAGNOSIS — R634 Abnormal weight loss: Secondary | ICD-10-CM | POA: Diagnosis not present

## 2021-02-16 DIAGNOSIS — Z681 Body mass index (BMI) 19 or less, adult: Secondary | ICD-10-CM | POA: Diagnosis not present

## 2021-02-17 DIAGNOSIS — F0281 Dementia in other diseases classified elsewhere with behavioral disturbance: Secondary | ICD-10-CM | POA: Diagnosis not present

## 2021-02-17 DIAGNOSIS — E43 Unspecified severe protein-calorie malnutrition: Secondary | ICD-10-CM | POA: Diagnosis not present

## 2021-02-17 DIAGNOSIS — G301 Alzheimer's disease with late onset: Secondary | ICD-10-CM | POA: Diagnosis not present

## 2021-02-17 DIAGNOSIS — R634 Abnormal weight loss: Secondary | ICD-10-CM | POA: Diagnosis not present

## 2021-02-17 DIAGNOSIS — Z681 Body mass index (BMI) 19 or less, adult: Secondary | ICD-10-CM | POA: Diagnosis not present

## 2021-02-17 DIAGNOSIS — Z8616 Personal history of COVID-19: Secondary | ICD-10-CM | POA: Diagnosis not present

## 2021-02-23 DIAGNOSIS — R634 Abnormal weight loss: Secondary | ICD-10-CM | POA: Diagnosis not present

## 2021-02-23 DIAGNOSIS — G301 Alzheimer's disease with late onset: Secondary | ICD-10-CM | POA: Diagnosis not present

## 2021-02-23 DIAGNOSIS — Z8616 Personal history of COVID-19: Secondary | ICD-10-CM | POA: Diagnosis not present

## 2021-02-23 DIAGNOSIS — F0281 Dementia in other diseases classified elsewhere with behavioral disturbance: Secondary | ICD-10-CM | POA: Diagnosis not present

## 2021-02-23 DIAGNOSIS — E43 Unspecified severe protein-calorie malnutrition: Secondary | ICD-10-CM | POA: Diagnosis not present

## 2021-02-23 DIAGNOSIS — Z681 Body mass index (BMI) 19 or less, adult: Secondary | ICD-10-CM | POA: Diagnosis not present

## 2021-02-24 DIAGNOSIS — E43 Unspecified severe protein-calorie malnutrition: Secondary | ICD-10-CM | POA: Diagnosis not present

## 2021-02-24 DIAGNOSIS — R634 Abnormal weight loss: Secondary | ICD-10-CM | POA: Diagnosis not present

## 2021-02-24 DIAGNOSIS — Z681 Body mass index (BMI) 19 or less, adult: Secondary | ICD-10-CM | POA: Diagnosis not present

## 2021-02-24 DIAGNOSIS — Z8616 Personal history of COVID-19: Secondary | ICD-10-CM | POA: Diagnosis not present

## 2021-02-24 DIAGNOSIS — F0281 Dementia in other diseases classified elsewhere with behavioral disturbance: Secondary | ICD-10-CM | POA: Diagnosis not present

## 2021-02-24 DIAGNOSIS — G301 Alzheimer's disease with late onset: Secondary | ICD-10-CM | POA: Diagnosis not present

## 2021-03-02 DIAGNOSIS — Z8616 Personal history of COVID-19: Secondary | ICD-10-CM | POA: Diagnosis not present

## 2021-03-02 DIAGNOSIS — E43 Unspecified severe protein-calorie malnutrition: Secondary | ICD-10-CM | POA: Diagnosis not present

## 2021-03-02 DIAGNOSIS — Z681 Body mass index (BMI) 19 or less, adult: Secondary | ICD-10-CM | POA: Diagnosis not present

## 2021-03-02 DIAGNOSIS — G301 Alzheimer's disease with late onset: Secondary | ICD-10-CM | POA: Diagnosis not present

## 2021-03-02 DIAGNOSIS — F0281 Dementia in other diseases classified elsewhere with behavioral disturbance: Secondary | ICD-10-CM | POA: Diagnosis not present

## 2021-03-02 DIAGNOSIS — R634 Abnormal weight loss: Secondary | ICD-10-CM | POA: Diagnosis not present

## 2021-03-03 DIAGNOSIS — Z8616 Personal history of COVID-19: Secondary | ICD-10-CM | POA: Diagnosis not present

## 2021-03-03 DIAGNOSIS — F0281 Dementia in other diseases classified elsewhere with behavioral disturbance: Secondary | ICD-10-CM | POA: Diagnosis not present

## 2021-03-03 DIAGNOSIS — R634 Abnormal weight loss: Secondary | ICD-10-CM | POA: Diagnosis not present

## 2021-03-03 DIAGNOSIS — E43 Unspecified severe protein-calorie malnutrition: Secondary | ICD-10-CM | POA: Diagnosis not present

## 2021-03-03 DIAGNOSIS — G301 Alzheimer's disease with late onset: Secondary | ICD-10-CM | POA: Diagnosis not present

## 2021-03-03 DIAGNOSIS — Z681 Body mass index (BMI) 19 or less, adult: Secondary | ICD-10-CM | POA: Diagnosis not present

## 2021-03-04 NOTE — Progress Notes (Deleted)
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  [name] accompanied by her daughter who supplements history.   UPDATE: [name] taking trazodone '50mg'$  at bedtime.  She no longer wants to take Exelon patch.  She lives with her husband.  Her daughter handles most of the finances.  She does not drive.  She needs assistance bathing and dressing.  She may wear dirty clothes.  She uses the toilet without assistance.  Occasional urinary incontinence.  She no longer keeps the home tidy.  Her daughter usually takes care of that.  Her husband gets the groceries and cooks.  Unless her husband has to run to the store, he is always with her.  Appetite has decreased.  She drinks two nutritional meals a day.  [name] pleasant.  Not combative. Her husband manages her medications.  She has had trouble sleeping.  Melatonin was ineffective.  We started her on trazodone last night.     HISTORY: She began having memory problems around 2012. At first she would forget conversations and repeats questions. This progressed to where she would easily forget errands that needed to be done. She began writing post-its to help remind her, but then often she would forget that she wrote something down. She has not had any problems with recognizing faces or recalling names of acquaintances or friends or family. She has had some difficulty with certain tasks. For example, her son said she came back and says she was not able to open up the P.O. Box at the post office. This is a P.O. Box that has been used for many years and has a specific type of daily. Therefore, it was determined that she either was trying to open the wrong box or was not  turning the key correctly. [name] also had difficulty in the past cranking the car.     She no longer manages the finances.   She says her mother had history of dementia.   She underwent formal neuropsychological testing on 01/28/14.  Testing revealed impairment in new learning and memory, language and executive functioning.  Findings are most consistent with mild to moderate Alzheimer's disease.   01/18/14 MRI BRAIN WO:  Mild diffuse cerebral atrophy, mild chronic small vessel ischemic changes.  Mild chronic bifrontal subarachnoid hemorrhage.  Follow up MRA of head drom 03/19/14 revealed no aneurysm. 01/07/14 LABS:  B12 932, TSH 2.627   Past medication:  Aricept, Namenda (GI upset)  PAST MEDICAL HISTORY: Past Medical History:  Diagnosis Date   Breast cancer ([place])    Cancer ([place])    Dupuytren's contracture of right hand 01/15/2015   Personal history of radiation therapy 12/2001   S/P lumpectomy of breast 2005   hx of breast cancer    MEDICATIONS: Current Outpatient Medications on File Prior to Visit  Medication Sig Dispense Refill   divalproex (DEPAKOTE SPRINKLE) 125 MG capsule Take 2 capsules twice a day (Patient taking differently: Take 250 mg by mouth 2 (two) times daily.) 120 capsule 2   ondansetron (ZOFRAN) 4 MG tablet Take 1 tablet (4 mg total) by mouth every 8 (eight) hours as needed for nausea or vomiting. (Patient not taking: No sig reported) 20 tablet 0   traZODone (DESYREL)  50 MG tablet Take 1 tablet (50 mg total) by mouth at bedtime. 30 tablet 5   No current facility-administered medications on file prior to visit.    ALLERGIES: No Known Allergies  FAMILY HISTORY: Family History  Problem Relation Age of Onset   Diabetes Mother    Stroke Mother    Thyroid disease Mother    Cancer Father 31       colon   Diabetes Brother       Objective:  *** General: No acute distress.  Patient appears ***-groomed.   Head:  Normocephalic/atraumatic Eyes:  Fundi examined but  not visualized Neck: supple, no paraspinal tenderness, full range of motion Heart:  Regular rate and rhythm Lungs:  Clear to auscultation bilaterally Back: No paraspinal tenderness Neurological Exam: alert and oriented to person and place but not time.  Speech fluent and not dysarthric, language intact.  CN II-XII intact. Bulk and tone normal, muscle strength 5/5 throughout.  Sensation to light touch intact.  Deep tendon reflexes 2+ throughout, toes downgoing.  Finger to nose testing intact.  Gait normal, Romberg negative.   Metta Clines, DO  CC: ***

## 2021-03-05 ENCOUNTER — Ambulatory Visit: Payer: Medicare Other | Admitting: Neurology

## 2021-03-09 DIAGNOSIS — F32A Depression, unspecified: Secondary | ICD-10-CM | POA: Diagnosis not present

## 2021-03-09 DIAGNOSIS — Z8616 Personal history of COVID-19: Secondary | ICD-10-CM | POA: Diagnosis not present

## 2021-03-09 DIAGNOSIS — Z8679 Personal history of other diseases of the circulatory system: Secondary | ICD-10-CM | POA: Diagnosis not present

## 2021-03-09 DIAGNOSIS — R410 Disorientation, unspecified: Secondary | ICD-10-CM | POA: Diagnosis not present

## 2021-03-09 DIAGNOSIS — Z853 Personal history of malignant neoplasm of breast: Secondary | ICD-10-CM | POA: Diagnosis not present

## 2021-03-09 DIAGNOSIS — E43 Unspecified severe protein-calorie malnutrition: Secondary | ICD-10-CM | POA: Diagnosis not present

## 2021-03-09 DIAGNOSIS — R634 Abnormal weight loss: Secondary | ICD-10-CM | POA: Diagnosis not present

## 2021-03-09 DIAGNOSIS — F0281 Dementia in other diseases classified elsewhere with behavioral disturbance: Secondary | ICD-10-CM | POA: Diagnosis not present

## 2021-03-09 DIAGNOSIS — G301 Alzheimer's disease with late onset: Secondary | ICD-10-CM | POA: Diagnosis not present

## 2021-03-09 DIAGNOSIS — Z681 Body mass index (BMI) 19 or less, adult: Secondary | ICD-10-CM | POA: Diagnosis not present

## 2021-03-10 DIAGNOSIS — Z8616 Personal history of COVID-19: Secondary | ICD-10-CM | POA: Diagnosis not present

## 2021-03-10 DIAGNOSIS — Z681 Body mass index (BMI) 19 or less, adult: Secondary | ICD-10-CM | POA: Diagnosis not present

## 2021-03-10 DIAGNOSIS — G301 Alzheimer's disease with late onset: Secondary | ICD-10-CM | POA: Diagnosis not present

## 2021-03-10 DIAGNOSIS — R634 Abnormal weight loss: Secondary | ICD-10-CM | POA: Diagnosis not present

## 2021-03-10 DIAGNOSIS — E43 Unspecified severe protein-calorie malnutrition: Secondary | ICD-10-CM | POA: Diagnosis not present

## 2021-03-10 DIAGNOSIS — F0281 Dementia in other diseases classified elsewhere with behavioral disturbance: Secondary | ICD-10-CM | POA: Diagnosis not present

## 2021-03-16 DIAGNOSIS — R634 Abnormal weight loss: Secondary | ICD-10-CM | POA: Diagnosis not present

## 2021-03-16 DIAGNOSIS — Z8616 Personal history of COVID-19: Secondary | ICD-10-CM | POA: Diagnosis not present

## 2021-03-16 DIAGNOSIS — Z681 Body mass index (BMI) 19 or less, adult: Secondary | ICD-10-CM | POA: Diagnosis not present

## 2021-03-16 DIAGNOSIS — G301 Alzheimer's disease with late onset: Secondary | ICD-10-CM | POA: Diagnosis not present

## 2021-03-16 DIAGNOSIS — E43 Unspecified severe protein-calorie malnutrition: Secondary | ICD-10-CM | POA: Diagnosis not present

## 2021-03-16 DIAGNOSIS — F0281 Dementia in other diseases classified elsewhere with behavioral disturbance: Secondary | ICD-10-CM | POA: Diagnosis not present

## 2021-03-17 DIAGNOSIS — G301 Alzheimer's disease with late onset: Secondary | ICD-10-CM | POA: Diagnosis not present

## 2021-03-17 DIAGNOSIS — E43 Unspecified severe protein-calorie malnutrition: Secondary | ICD-10-CM | POA: Diagnosis not present

## 2021-03-17 DIAGNOSIS — Z681 Body mass index (BMI) 19 or less, adult: Secondary | ICD-10-CM | POA: Diagnosis not present

## 2021-03-17 DIAGNOSIS — F0281 Dementia in other diseases classified elsewhere with behavioral disturbance: Secondary | ICD-10-CM | POA: Diagnosis not present

## 2021-03-17 DIAGNOSIS — Z8616 Personal history of COVID-19: Secondary | ICD-10-CM | POA: Diagnosis not present

## 2021-03-17 DIAGNOSIS — R634 Abnormal weight loss: Secondary | ICD-10-CM | POA: Diagnosis not present

## 2021-03-23 DIAGNOSIS — R634 Abnormal weight loss: Secondary | ICD-10-CM | POA: Diagnosis not present

## 2021-03-23 DIAGNOSIS — Z681 Body mass index (BMI) 19 or less, adult: Secondary | ICD-10-CM | POA: Diagnosis not present

## 2021-03-23 DIAGNOSIS — Z8616 Personal history of COVID-19: Secondary | ICD-10-CM | POA: Diagnosis not present

## 2021-03-23 DIAGNOSIS — G301 Alzheimer's disease with late onset: Secondary | ICD-10-CM | POA: Diagnosis not present

## 2021-03-23 DIAGNOSIS — F0281 Dementia in other diseases classified elsewhere with behavioral disturbance: Secondary | ICD-10-CM | POA: Diagnosis not present

## 2021-03-23 DIAGNOSIS — E43 Unspecified severe protein-calorie malnutrition: Secondary | ICD-10-CM | POA: Diagnosis not present

## 2021-03-30 DIAGNOSIS — F0281 Dementia in other diseases classified elsewhere with behavioral disturbance: Secondary | ICD-10-CM | POA: Diagnosis not present

## 2021-03-30 DIAGNOSIS — G301 Alzheimer's disease with late onset: Secondary | ICD-10-CM | POA: Diagnosis not present

## 2021-03-30 DIAGNOSIS — E43 Unspecified severe protein-calorie malnutrition: Secondary | ICD-10-CM | POA: Diagnosis not present

## 2021-03-30 DIAGNOSIS — R634 Abnormal weight loss: Secondary | ICD-10-CM | POA: Diagnosis not present

## 2021-03-30 DIAGNOSIS — Z681 Body mass index (BMI) 19 or less, adult: Secondary | ICD-10-CM | POA: Diagnosis not present

## 2021-03-30 DIAGNOSIS — Z8616 Personal history of COVID-19: Secondary | ICD-10-CM | POA: Diagnosis not present

## 2021-03-31 DIAGNOSIS — G301 Alzheimer's disease with late onset: Secondary | ICD-10-CM | POA: Diagnosis not present

## 2021-03-31 DIAGNOSIS — R634 Abnormal weight loss: Secondary | ICD-10-CM | POA: Diagnosis not present

## 2021-03-31 DIAGNOSIS — Z681 Body mass index (BMI) 19 or less, adult: Secondary | ICD-10-CM | POA: Diagnosis not present

## 2021-03-31 DIAGNOSIS — Z8616 Personal history of COVID-19: Secondary | ICD-10-CM | POA: Diagnosis not present

## 2021-03-31 DIAGNOSIS — E43 Unspecified severe protein-calorie malnutrition: Secondary | ICD-10-CM | POA: Diagnosis not present

## 2021-03-31 DIAGNOSIS — F0281 Dementia in other diseases classified elsewhere with behavioral disturbance: Secondary | ICD-10-CM | POA: Diagnosis not present

## 2021-04-06 DIAGNOSIS — R634 Abnormal weight loss: Secondary | ICD-10-CM | POA: Diagnosis not present

## 2021-04-06 DIAGNOSIS — E43 Unspecified severe protein-calorie malnutrition: Secondary | ICD-10-CM | POA: Diagnosis not present

## 2021-04-06 DIAGNOSIS — Z681 Body mass index (BMI) 19 or less, adult: Secondary | ICD-10-CM | POA: Diagnosis not present

## 2021-04-06 DIAGNOSIS — G301 Alzheimer's disease with late onset: Secondary | ICD-10-CM | POA: Diagnosis not present

## 2021-04-06 DIAGNOSIS — Z8616 Personal history of COVID-19: Secondary | ICD-10-CM | POA: Diagnosis not present

## 2021-04-06 DIAGNOSIS — F0281 Dementia in other diseases classified elsewhere with behavioral disturbance: Secondary | ICD-10-CM | POA: Diagnosis not present

## 2021-04-09 DIAGNOSIS — Z681 Body mass index (BMI) 19 or less, adult: Secondary | ICD-10-CM | POA: Diagnosis not present

## 2021-04-09 DIAGNOSIS — R634 Abnormal weight loss: Secondary | ICD-10-CM | POA: Diagnosis not present

## 2021-04-09 DIAGNOSIS — R410 Disorientation, unspecified: Secondary | ICD-10-CM | POA: Diagnosis not present

## 2021-04-09 DIAGNOSIS — G301 Alzheimer's disease with late onset: Secondary | ICD-10-CM | POA: Diagnosis not present

## 2021-04-09 DIAGNOSIS — Z853 Personal history of malignant neoplasm of breast: Secondary | ICD-10-CM | POA: Diagnosis not present

## 2021-04-09 DIAGNOSIS — Z8679 Personal history of other diseases of the circulatory system: Secondary | ICD-10-CM | POA: Diagnosis not present

## 2021-04-09 DIAGNOSIS — E43 Unspecified severe protein-calorie malnutrition: Secondary | ICD-10-CM | POA: Diagnosis not present

## 2021-04-09 DIAGNOSIS — Z8616 Personal history of COVID-19: Secondary | ICD-10-CM | POA: Diagnosis not present

## 2021-04-09 DIAGNOSIS — F32A Depression, unspecified: Secondary | ICD-10-CM | POA: Diagnosis not present

## 2021-04-09 DIAGNOSIS — F0281 Dementia in other diseases classified elsewhere with behavioral disturbance: Secondary | ICD-10-CM | POA: Diagnosis not present

## 2021-04-14 DIAGNOSIS — Z8616 Personal history of COVID-19: Secondary | ICD-10-CM | POA: Diagnosis not present

## 2021-04-14 DIAGNOSIS — R634 Abnormal weight loss: Secondary | ICD-10-CM | POA: Diagnosis not present

## 2021-04-14 DIAGNOSIS — E43 Unspecified severe protein-calorie malnutrition: Secondary | ICD-10-CM | POA: Diagnosis not present

## 2021-04-14 DIAGNOSIS — F0281 Dementia in other diseases classified elsewhere with behavioral disturbance: Secondary | ICD-10-CM | POA: Diagnosis not present

## 2021-04-14 DIAGNOSIS — G301 Alzheimer's disease with late onset: Secondary | ICD-10-CM | POA: Diagnosis not present

## 2021-04-14 DIAGNOSIS — Z681 Body mass index (BMI) 19 or less, adult: Secondary | ICD-10-CM | POA: Diagnosis not present

## 2021-04-18 ENCOUNTER — Telehealth: Payer: Self-pay

## 2021-04-18 NOTE — Telephone Encounter (Signed)
Spoke with patient husband in regards to patient AWV. States patient is non-verbal and bedridden.  States someone can call him on Monday 04/20/21 and he can try to answer questions.

## 2021-04-20 DIAGNOSIS — F0281 Dementia in other diseases classified elsewhere with behavioral disturbance: Secondary | ICD-10-CM | POA: Diagnosis not present

## 2021-04-20 DIAGNOSIS — G301 Alzheimer's disease with late onset: Secondary | ICD-10-CM | POA: Diagnosis not present

## 2021-04-20 DIAGNOSIS — E43 Unspecified severe protein-calorie malnutrition: Secondary | ICD-10-CM | POA: Diagnosis not present

## 2021-04-20 DIAGNOSIS — R634 Abnormal weight loss: Secondary | ICD-10-CM | POA: Diagnosis not present

## 2021-04-20 DIAGNOSIS — Z681 Body mass index (BMI) 19 or less, adult: Secondary | ICD-10-CM | POA: Diagnosis not present

## 2021-04-20 DIAGNOSIS — Z8616 Personal history of COVID-19: Secondary | ICD-10-CM | POA: Diagnosis not present

## 2021-04-21 DIAGNOSIS — R634 Abnormal weight loss: Secondary | ICD-10-CM | POA: Diagnosis not present

## 2021-04-21 DIAGNOSIS — Z681 Body mass index (BMI) 19 or less, adult: Secondary | ICD-10-CM | POA: Diagnosis not present

## 2021-04-21 DIAGNOSIS — G301 Alzheimer's disease with late onset: Secondary | ICD-10-CM | POA: Diagnosis not present

## 2021-04-21 DIAGNOSIS — E43 Unspecified severe protein-calorie malnutrition: Secondary | ICD-10-CM | POA: Diagnosis not present

## 2021-04-21 DIAGNOSIS — Z8616 Personal history of COVID-19: Secondary | ICD-10-CM | POA: Diagnosis not present

## 2021-04-21 DIAGNOSIS — F0281 Dementia in other diseases classified elsewhere with behavioral disturbance: Secondary | ICD-10-CM | POA: Diagnosis not present

## 2021-04-27 DIAGNOSIS — E43 Unspecified severe protein-calorie malnutrition: Secondary | ICD-10-CM | POA: Diagnosis not present

## 2021-04-27 DIAGNOSIS — R634 Abnormal weight loss: Secondary | ICD-10-CM | POA: Diagnosis not present

## 2021-04-27 DIAGNOSIS — Z681 Body mass index (BMI) 19 or less, adult: Secondary | ICD-10-CM | POA: Diagnosis not present

## 2021-04-27 DIAGNOSIS — Z8616 Personal history of COVID-19: Secondary | ICD-10-CM | POA: Diagnosis not present

## 2021-04-27 DIAGNOSIS — F0281 Dementia in other diseases classified elsewhere with behavioral disturbance: Secondary | ICD-10-CM | POA: Diagnosis not present

## 2021-04-27 DIAGNOSIS — G301 Alzheimer's disease with late onset: Secondary | ICD-10-CM | POA: Diagnosis not present

## 2021-04-28 DIAGNOSIS — G301 Alzheimer's disease with late onset: Secondary | ICD-10-CM | POA: Diagnosis not present

## 2021-04-28 DIAGNOSIS — Z8616 Personal history of COVID-19: Secondary | ICD-10-CM | POA: Diagnosis not present

## 2021-04-28 DIAGNOSIS — Z681 Body mass index (BMI) 19 or less, adult: Secondary | ICD-10-CM | POA: Diagnosis not present

## 2021-04-28 DIAGNOSIS — E43 Unspecified severe protein-calorie malnutrition: Secondary | ICD-10-CM | POA: Diagnosis not present

## 2021-04-28 DIAGNOSIS — F0281 Dementia in other diseases classified elsewhere with behavioral disturbance: Secondary | ICD-10-CM | POA: Diagnosis not present

## 2021-04-28 DIAGNOSIS — R634 Abnormal weight loss: Secondary | ICD-10-CM | POA: Diagnosis not present

## 2021-04-30 NOTE — Telephone Encounter (Signed)
Unable to reach anyone to try and scheduled an AWV.  Dm/cma

## 2021-05-04 DIAGNOSIS — F0281 Dementia in other diseases classified elsewhere with behavioral disturbance: Secondary | ICD-10-CM | POA: Diagnosis not present

## 2021-05-04 DIAGNOSIS — Z681 Body mass index (BMI) 19 or less, adult: Secondary | ICD-10-CM | POA: Diagnosis not present

## 2021-05-04 DIAGNOSIS — G301 Alzheimer's disease with late onset: Secondary | ICD-10-CM | POA: Diagnosis not present

## 2021-05-04 DIAGNOSIS — E43 Unspecified severe protein-calorie malnutrition: Secondary | ICD-10-CM | POA: Diagnosis not present

## 2021-05-04 DIAGNOSIS — Z8616 Personal history of COVID-19: Secondary | ICD-10-CM | POA: Diagnosis not present

## 2021-05-04 DIAGNOSIS — R634 Abnormal weight loss: Secondary | ICD-10-CM | POA: Diagnosis not present

## 2021-05-05 DIAGNOSIS — F0281 Dementia in other diseases classified elsewhere with behavioral disturbance: Secondary | ICD-10-CM | POA: Diagnosis not present

## 2021-05-05 DIAGNOSIS — G301 Alzheimer's disease with late onset: Secondary | ICD-10-CM | POA: Diagnosis not present

## 2021-05-05 DIAGNOSIS — E43 Unspecified severe protein-calorie malnutrition: Secondary | ICD-10-CM | POA: Diagnosis not present

## 2021-05-05 DIAGNOSIS — R634 Abnormal weight loss: Secondary | ICD-10-CM | POA: Diagnosis not present

## 2021-05-05 DIAGNOSIS — Z8616 Personal history of COVID-19: Secondary | ICD-10-CM | POA: Diagnosis not present

## 2021-05-05 DIAGNOSIS — Z681 Body mass index (BMI) 19 or less, adult: Secondary | ICD-10-CM | POA: Diagnosis not present

## 2021-05-09 DIAGNOSIS — Z681 Body mass index (BMI) 19 or less, adult: Secondary | ICD-10-CM | POA: Diagnosis not present

## 2021-05-09 DIAGNOSIS — R410 Disorientation, unspecified: Secondary | ICD-10-CM | POA: Diagnosis not present

## 2021-05-09 DIAGNOSIS — Z8679 Personal history of other diseases of the circulatory system: Secondary | ICD-10-CM | POA: Diagnosis not present

## 2021-05-09 DIAGNOSIS — G301 Alzheimer's disease with late onset: Secondary | ICD-10-CM | POA: Diagnosis not present

## 2021-05-09 DIAGNOSIS — Z8616 Personal history of COVID-19: Secondary | ICD-10-CM | POA: Diagnosis not present

## 2021-05-09 DIAGNOSIS — R634 Abnormal weight loss: Secondary | ICD-10-CM | POA: Diagnosis not present

## 2021-05-09 DIAGNOSIS — E43 Unspecified severe protein-calorie malnutrition: Secondary | ICD-10-CM | POA: Diagnosis not present

## 2021-05-09 DIAGNOSIS — F02811 Dementia in other diseases classified elsewhere, unspecified severity, with agitation: Secondary | ICD-10-CM | POA: Diagnosis not present

## 2021-05-09 DIAGNOSIS — Z853 Personal history of malignant neoplasm of breast: Secondary | ICD-10-CM | POA: Diagnosis not present

## 2021-05-09 DIAGNOSIS — F32A Depression, unspecified: Secondary | ICD-10-CM | POA: Diagnosis not present

## 2021-05-09 DIAGNOSIS — F0284 Dementia in other diseases classified elsewhere, unspecified severity, with anxiety: Secondary | ICD-10-CM | POA: Diagnosis not present

## 2021-05-11 DIAGNOSIS — Z681 Body mass index (BMI) 19 or less, adult: Secondary | ICD-10-CM | POA: Diagnosis not present

## 2021-05-11 DIAGNOSIS — F0284 Dementia in other diseases classified elsewhere, unspecified severity, with anxiety: Secondary | ICD-10-CM | POA: Diagnosis not present

## 2021-05-11 DIAGNOSIS — F02811 Dementia in other diseases classified elsewhere, unspecified severity, with agitation: Secondary | ICD-10-CM | POA: Diagnosis not present

## 2021-05-11 DIAGNOSIS — R634 Abnormal weight loss: Secondary | ICD-10-CM | POA: Diagnosis not present

## 2021-05-11 DIAGNOSIS — G301 Alzheimer's disease with late onset: Secondary | ICD-10-CM | POA: Diagnosis not present

## 2021-05-11 DIAGNOSIS — E43 Unspecified severe protein-calorie malnutrition: Secondary | ICD-10-CM | POA: Diagnosis not present

## 2021-05-14 DIAGNOSIS — G301 Alzheimer's disease with late onset: Secondary | ICD-10-CM | POA: Diagnosis not present

## 2021-05-14 DIAGNOSIS — E43 Unspecified severe protein-calorie malnutrition: Secondary | ICD-10-CM | POA: Diagnosis not present

## 2021-05-14 DIAGNOSIS — Z681 Body mass index (BMI) 19 or less, adult: Secondary | ICD-10-CM | POA: Diagnosis not present

## 2021-05-14 DIAGNOSIS — F02811 Dementia in other diseases classified elsewhere, unspecified severity, with agitation: Secondary | ICD-10-CM | POA: Diagnosis not present

## 2021-05-14 DIAGNOSIS — R634 Abnormal weight loss: Secondary | ICD-10-CM | POA: Diagnosis not present

## 2021-05-14 DIAGNOSIS — F0284 Dementia in other diseases classified elsewhere, unspecified severity, with anxiety: Secondary | ICD-10-CM | POA: Diagnosis not present

## 2021-05-18 DIAGNOSIS — G301 Alzheimer's disease with late onset: Secondary | ICD-10-CM | POA: Diagnosis not present

## 2021-05-18 DIAGNOSIS — R634 Abnormal weight loss: Secondary | ICD-10-CM | POA: Diagnosis not present

## 2021-05-18 DIAGNOSIS — F0284 Dementia in other diseases classified elsewhere, unspecified severity, with anxiety: Secondary | ICD-10-CM | POA: Diagnosis not present

## 2021-05-18 DIAGNOSIS — Z681 Body mass index (BMI) 19 or less, adult: Secondary | ICD-10-CM | POA: Diagnosis not present

## 2021-05-18 DIAGNOSIS — F02811 Dementia in other diseases classified elsewhere, unspecified severity, with agitation: Secondary | ICD-10-CM | POA: Diagnosis not present

## 2021-05-18 DIAGNOSIS — E43 Unspecified severe protein-calorie malnutrition: Secondary | ICD-10-CM | POA: Diagnosis not present

## 2021-05-25 DIAGNOSIS — E43 Unspecified severe protein-calorie malnutrition: Secondary | ICD-10-CM | POA: Diagnosis not present

## 2021-05-25 DIAGNOSIS — R634 Abnormal weight loss: Secondary | ICD-10-CM | POA: Diagnosis not present

## 2021-05-25 DIAGNOSIS — F0284 Dementia in other diseases classified elsewhere, unspecified severity, with anxiety: Secondary | ICD-10-CM | POA: Diagnosis not present

## 2021-05-25 DIAGNOSIS — G301 Alzheimer's disease with late onset: Secondary | ICD-10-CM | POA: Diagnosis not present

## 2021-05-25 DIAGNOSIS — F02811 Dementia in other diseases classified elsewhere, unspecified severity, with agitation: Secondary | ICD-10-CM | POA: Diagnosis not present

## 2021-05-25 DIAGNOSIS — Z681 Body mass index (BMI) 19 or less, adult: Secondary | ICD-10-CM | POA: Diagnosis not present

## 2021-06-01 DIAGNOSIS — F02811 Dementia in other diseases classified elsewhere, unspecified severity, with agitation: Secondary | ICD-10-CM | POA: Diagnosis not present

## 2021-06-01 DIAGNOSIS — Z681 Body mass index (BMI) 19 or less, adult: Secondary | ICD-10-CM | POA: Diagnosis not present

## 2021-06-01 DIAGNOSIS — R634 Abnormal weight loss: Secondary | ICD-10-CM | POA: Diagnosis not present

## 2021-06-01 DIAGNOSIS — E43 Unspecified severe protein-calorie malnutrition: Secondary | ICD-10-CM | POA: Diagnosis not present

## 2021-06-01 DIAGNOSIS — G301 Alzheimer's disease with late onset: Secondary | ICD-10-CM | POA: Diagnosis not present

## 2021-06-01 DIAGNOSIS — F0284 Dementia in other diseases classified elsewhere, unspecified severity, with anxiety: Secondary | ICD-10-CM | POA: Diagnosis not present

## 2021-06-04 DIAGNOSIS — Z681 Body mass index (BMI) 19 or less, adult: Secondary | ICD-10-CM | POA: Diagnosis not present

## 2021-06-04 DIAGNOSIS — G301 Alzheimer's disease with late onset: Secondary | ICD-10-CM | POA: Diagnosis not present

## 2021-06-04 DIAGNOSIS — E43 Unspecified severe protein-calorie malnutrition: Secondary | ICD-10-CM | POA: Diagnosis not present

## 2021-06-04 DIAGNOSIS — F0284 Dementia in other diseases classified elsewhere, unspecified severity, with anxiety: Secondary | ICD-10-CM | POA: Diagnosis not present

## 2021-06-04 DIAGNOSIS — F02811 Dementia in other diseases classified elsewhere, unspecified severity, with agitation: Secondary | ICD-10-CM | POA: Diagnosis not present

## 2021-06-04 DIAGNOSIS — R634 Abnormal weight loss: Secondary | ICD-10-CM | POA: Diagnosis not present

## 2021-06-08 DIAGNOSIS — E43 Unspecified severe protein-calorie malnutrition: Secondary | ICD-10-CM | POA: Diagnosis not present

## 2021-06-08 DIAGNOSIS — G301 Alzheimer's disease with late onset: Secondary | ICD-10-CM | POA: Diagnosis not present

## 2021-06-08 DIAGNOSIS — Z681 Body mass index (BMI) 19 or less, adult: Secondary | ICD-10-CM | POA: Diagnosis not present

## 2021-06-08 DIAGNOSIS — F02811 Dementia in other diseases classified elsewhere, unspecified severity, with agitation: Secondary | ICD-10-CM | POA: Diagnosis not present

## 2021-06-08 DIAGNOSIS — F0284 Dementia in other diseases classified elsewhere, unspecified severity, with anxiety: Secondary | ICD-10-CM | POA: Diagnosis not present

## 2021-06-08 DIAGNOSIS — R634 Abnormal weight loss: Secondary | ICD-10-CM | POA: Diagnosis not present

## 2021-06-09 DIAGNOSIS — E43 Unspecified severe protein-calorie malnutrition: Secondary | ICD-10-CM | POA: Diagnosis not present

## 2021-06-09 DIAGNOSIS — Z8616 Personal history of COVID-19: Secondary | ICD-10-CM | POA: Diagnosis not present

## 2021-06-09 DIAGNOSIS — F02811 Dementia in other diseases classified elsewhere, unspecified severity, with agitation: Secondary | ICD-10-CM | POA: Diagnosis not present

## 2021-06-09 DIAGNOSIS — F0284 Dementia in other diseases classified elsewhere, unspecified severity, with anxiety: Secondary | ICD-10-CM | POA: Diagnosis not present

## 2021-06-09 DIAGNOSIS — Z681 Body mass index (BMI) 19 or less, adult: Secondary | ICD-10-CM | POA: Diagnosis not present

## 2021-06-09 DIAGNOSIS — R410 Disorientation, unspecified: Secondary | ICD-10-CM | POA: Diagnosis not present

## 2021-06-09 DIAGNOSIS — Z853 Personal history of malignant neoplasm of breast: Secondary | ICD-10-CM | POA: Diagnosis not present

## 2021-06-09 DIAGNOSIS — G301 Alzheimer's disease with late onset: Secondary | ICD-10-CM | POA: Diagnosis not present

## 2021-06-09 DIAGNOSIS — F32A Depression, unspecified: Secondary | ICD-10-CM | POA: Diagnosis not present

## 2021-06-09 DIAGNOSIS — Z8679 Personal history of other diseases of the circulatory system: Secondary | ICD-10-CM | POA: Diagnosis not present

## 2021-06-09 DIAGNOSIS — R634 Abnormal weight loss: Secondary | ICD-10-CM | POA: Diagnosis not present

## 2021-06-10 DIAGNOSIS — Z681 Body mass index (BMI) 19 or less, adult: Secondary | ICD-10-CM | POA: Diagnosis not present

## 2021-06-10 DIAGNOSIS — R634 Abnormal weight loss: Secondary | ICD-10-CM | POA: Diagnosis not present

## 2021-06-10 DIAGNOSIS — E43 Unspecified severe protein-calorie malnutrition: Secondary | ICD-10-CM | POA: Diagnosis not present

## 2021-06-10 DIAGNOSIS — G301 Alzheimer's disease with late onset: Secondary | ICD-10-CM | POA: Diagnosis not present

## 2021-06-10 DIAGNOSIS — F0284 Dementia in other diseases classified elsewhere, unspecified severity, with anxiety: Secondary | ICD-10-CM | POA: Diagnosis not present

## 2021-06-10 DIAGNOSIS — F02811 Dementia in other diseases classified elsewhere, unspecified severity, with agitation: Secondary | ICD-10-CM | POA: Diagnosis not present

## 2021-06-15 DIAGNOSIS — R634 Abnormal weight loss: Secondary | ICD-10-CM | POA: Diagnosis not present

## 2021-06-15 DIAGNOSIS — F02811 Dementia in other diseases classified elsewhere, unspecified severity, with agitation: Secondary | ICD-10-CM | POA: Diagnosis not present

## 2021-06-15 DIAGNOSIS — Z681 Body mass index (BMI) 19 or less, adult: Secondary | ICD-10-CM | POA: Diagnosis not present

## 2021-06-15 DIAGNOSIS — E43 Unspecified severe protein-calorie malnutrition: Secondary | ICD-10-CM | POA: Diagnosis not present

## 2021-06-15 DIAGNOSIS — G301 Alzheimer's disease with late onset: Secondary | ICD-10-CM | POA: Diagnosis not present

## 2021-06-15 DIAGNOSIS — F0284 Dementia in other diseases classified elsewhere, unspecified severity, with anxiety: Secondary | ICD-10-CM | POA: Diagnosis not present

## 2021-06-22 DIAGNOSIS — F02811 Dementia in other diseases classified elsewhere, unspecified severity, with agitation: Secondary | ICD-10-CM | POA: Diagnosis not present

## 2021-06-22 DIAGNOSIS — E43 Unspecified severe protein-calorie malnutrition: Secondary | ICD-10-CM | POA: Diagnosis not present

## 2021-06-22 DIAGNOSIS — F0284 Dementia in other diseases classified elsewhere, unspecified severity, with anxiety: Secondary | ICD-10-CM | POA: Diagnosis not present

## 2021-06-22 DIAGNOSIS — Z681 Body mass index (BMI) 19 or less, adult: Secondary | ICD-10-CM | POA: Diagnosis not present

## 2021-06-22 DIAGNOSIS — R634 Abnormal weight loss: Secondary | ICD-10-CM | POA: Diagnosis not present

## 2021-06-22 DIAGNOSIS — G301 Alzheimer's disease with late onset: Secondary | ICD-10-CM | POA: Diagnosis not present

## 2021-06-25 DIAGNOSIS — G301 Alzheimer's disease with late onset: Secondary | ICD-10-CM | POA: Diagnosis not present

## 2021-06-25 DIAGNOSIS — E43 Unspecified severe protein-calorie malnutrition: Secondary | ICD-10-CM | POA: Diagnosis not present

## 2021-06-25 DIAGNOSIS — F02811 Dementia in other diseases classified elsewhere, unspecified severity, with agitation: Secondary | ICD-10-CM | POA: Diagnosis not present

## 2021-06-25 DIAGNOSIS — R634 Abnormal weight loss: Secondary | ICD-10-CM | POA: Diagnosis not present

## 2021-06-25 DIAGNOSIS — Z681 Body mass index (BMI) 19 or less, adult: Secondary | ICD-10-CM | POA: Diagnosis not present

## 2021-06-25 DIAGNOSIS — F0284 Dementia in other diseases classified elsewhere, unspecified severity, with anxiety: Secondary | ICD-10-CM | POA: Diagnosis not present

## 2021-06-29 DIAGNOSIS — F0284 Dementia in other diseases classified elsewhere, unspecified severity, with anxiety: Secondary | ICD-10-CM | POA: Diagnosis not present

## 2021-06-29 DIAGNOSIS — R634 Abnormal weight loss: Secondary | ICD-10-CM | POA: Diagnosis not present

## 2021-06-29 DIAGNOSIS — E43 Unspecified severe protein-calorie malnutrition: Secondary | ICD-10-CM | POA: Diagnosis not present

## 2021-06-29 DIAGNOSIS — Z681 Body mass index (BMI) 19 or less, adult: Secondary | ICD-10-CM | POA: Diagnosis not present

## 2021-06-29 DIAGNOSIS — F02811 Dementia in other diseases classified elsewhere, unspecified severity, with agitation: Secondary | ICD-10-CM | POA: Diagnosis not present

## 2021-06-29 DIAGNOSIS — G301 Alzheimer's disease with late onset: Secondary | ICD-10-CM | POA: Diagnosis not present

## 2021-07-06 DIAGNOSIS — G301 Alzheimer's disease with late onset: Secondary | ICD-10-CM | POA: Diagnosis not present

## 2021-07-06 DIAGNOSIS — F02811 Dementia in other diseases classified elsewhere, unspecified severity, with agitation: Secondary | ICD-10-CM | POA: Diagnosis not present

## 2021-07-06 DIAGNOSIS — E43 Unspecified severe protein-calorie malnutrition: Secondary | ICD-10-CM | POA: Diagnosis not present

## 2021-07-06 DIAGNOSIS — R634 Abnormal weight loss: Secondary | ICD-10-CM | POA: Diagnosis not present

## 2021-07-06 DIAGNOSIS — F0284 Dementia in other diseases classified elsewhere, unspecified severity, with anxiety: Secondary | ICD-10-CM | POA: Diagnosis not present

## 2021-07-06 DIAGNOSIS — Z681 Body mass index (BMI) 19 or less, adult: Secondary | ICD-10-CM | POA: Diagnosis not present

## 2021-07-09 DIAGNOSIS — Z8679 Personal history of other diseases of the circulatory system: Secondary | ICD-10-CM | POA: Diagnosis not present

## 2021-07-09 DIAGNOSIS — R410 Disorientation, unspecified: Secondary | ICD-10-CM | POA: Diagnosis not present

## 2021-07-09 DIAGNOSIS — Z8616 Personal history of COVID-19: Secondary | ICD-10-CM | POA: Diagnosis not present

## 2021-07-09 DIAGNOSIS — R634 Abnormal weight loss: Secondary | ICD-10-CM | POA: Diagnosis not present

## 2021-07-09 DIAGNOSIS — Z681 Body mass index (BMI) 19 or less, adult: Secondary | ICD-10-CM | POA: Diagnosis not present

## 2021-07-09 DIAGNOSIS — F02811 Dementia in other diseases classified elsewhere, unspecified severity, with agitation: Secondary | ICD-10-CM | POA: Diagnosis not present

## 2021-07-09 DIAGNOSIS — Z853 Personal history of malignant neoplasm of breast: Secondary | ICD-10-CM | POA: Diagnosis not present

## 2021-07-09 DIAGNOSIS — F32A Depression, unspecified: Secondary | ICD-10-CM | POA: Diagnosis not present

## 2021-07-09 DIAGNOSIS — G301 Alzheimer's disease with late onset: Secondary | ICD-10-CM | POA: Diagnosis not present

## 2021-07-09 DIAGNOSIS — E43 Unspecified severe protein-calorie malnutrition: Secondary | ICD-10-CM | POA: Diagnosis not present

## 2021-07-09 DIAGNOSIS — F0284 Dementia in other diseases classified elsewhere, unspecified severity, with anxiety: Secondary | ICD-10-CM | POA: Diagnosis not present

## 2021-07-13 DIAGNOSIS — Z681 Body mass index (BMI) 19 or less, adult: Secondary | ICD-10-CM | POA: Diagnosis not present

## 2021-07-13 DIAGNOSIS — G301 Alzheimer's disease with late onset: Secondary | ICD-10-CM | POA: Diagnosis not present

## 2021-07-13 DIAGNOSIS — F02811 Dementia in other diseases classified elsewhere, unspecified severity, with agitation: Secondary | ICD-10-CM | POA: Diagnosis not present

## 2021-07-13 DIAGNOSIS — R634 Abnormal weight loss: Secondary | ICD-10-CM | POA: Diagnosis not present

## 2021-07-13 DIAGNOSIS — E43 Unspecified severe protein-calorie malnutrition: Secondary | ICD-10-CM | POA: Diagnosis not present

## 2021-07-13 DIAGNOSIS — F0284 Dementia in other diseases classified elsewhere, unspecified severity, with anxiety: Secondary | ICD-10-CM | POA: Diagnosis not present

## 2021-07-14 DIAGNOSIS — F0284 Dementia in other diseases classified elsewhere, unspecified severity, with anxiety: Secondary | ICD-10-CM | POA: Diagnosis not present

## 2021-07-14 DIAGNOSIS — Z681 Body mass index (BMI) 19 or less, adult: Secondary | ICD-10-CM | POA: Diagnosis not present

## 2021-07-14 DIAGNOSIS — R634 Abnormal weight loss: Secondary | ICD-10-CM | POA: Diagnosis not present

## 2021-07-14 DIAGNOSIS — E43 Unspecified severe protein-calorie malnutrition: Secondary | ICD-10-CM | POA: Diagnosis not present

## 2021-07-14 DIAGNOSIS — G301 Alzheimer's disease with late onset: Secondary | ICD-10-CM | POA: Diagnosis not present

## 2021-07-14 DIAGNOSIS — F02811 Dementia in other diseases classified elsewhere, unspecified severity, with agitation: Secondary | ICD-10-CM | POA: Diagnosis not present

## 2021-07-20 DIAGNOSIS — Z681 Body mass index (BMI) 19 or less, adult: Secondary | ICD-10-CM | POA: Diagnosis not present

## 2021-07-20 DIAGNOSIS — G301 Alzheimer's disease with late onset: Secondary | ICD-10-CM | POA: Diagnosis not present

## 2021-07-20 DIAGNOSIS — F02811 Dementia in other diseases classified elsewhere, unspecified severity, with agitation: Secondary | ICD-10-CM | POA: Diagnosis not present

## 2021-07-20 DIAGNOSIS — R634 Abnormal weight loss: Secondary | ICD-10-CM | POA: Diagnosis not present

## 2021-07-20 DIAGNOSIS — F0284 Dementia in other diseases classified elsewhere, unspecified severity, with anxiety: Secondary | ICD-10-CM | POA: Diagnosis not present

## 2021-07-20 DIAGNOSIS — E43 Unspecified severe protein-calorie malnutrition: Secondary | ICD-10-CM | POA: Diagnosis not present

## 2021-07-21 DIAGNOSIS — R634 Abnormal weight loss: Secondary | ICD-10-CM | POA: Diagnosis not present

## 2021-07-21 DIAGNOSIS — Z681 Body mass index (BMI) 19 or less, adult: Secondary | ICD-10-CM | POA: Diagnosis not present

## 2021-07-21 DIAGNOSIS — F02811 Dementia in other diseases classified elsewhere, unspecified severity, with agitation: Secondary | ICD-10-CM | POA: Diagnosis not present

## 2021-07-21 DIAGNOSIS — G301 Alzheimer's disease with late onset: Secondary | ICD-10-CM | POA: Diagnosis not present

## 2021-07-21 DIAGNOSIS — F0284 Dementia in other diseases classified elsewhere, unspecified severity, with anxiety: Secondary | ICD-10-CM | POA: Diagnosis not present

## 2021-07-21 DIAGNOSIS — E43 Unspecified severe protein-calorie malnutrition: Secondary | ICD-10-CM | POA: Diagnosis not present

## 2021-07-27 DIAGNOSIS — G301 Alzheimer's disease with late onset: Secondary | ICD-10-CM | POA: Diagnosis not present

## 2021-07-27 DIAGNOSIS — E43 Unspecified severe protein-calorie malnutrition: Secondary | ICD-10-CM | POA: Diagnosis not present

## 2021-07-27 DIAGNOSIS — F02811 Dementia in other diseases classified elsewhere, unspecified severity, with agitation: Secondary | ICD-10-CM | POA: Diagnosis not present

## 2021-07-27 DIAGNOSIS — Z681 Body mass index (BMI) 19 or less, adult: Secondary | ICD-10-CM | POA: Diagnosis not present

## 2021-07-27 DIAGNOSIS — R634 Abnormal weight loss: Secondary | ICD-10-CM | POA: Diagnosis not present

## 2021-07-27 DIAGNOSIS — F0284 Dementia in other diseases classified elsewhere, unspecified severity, with anxiety: Secondary | ICD-10-CM | POA: Diagnosis not present

## 2021-07-29 DIAGNOSIS — G301 Alzheimer's disease with late onset: Secondary | ICD-10-CM | POA: Diagnosis not present

## 2021-07-29 DIAGNOSIS — R634 Abnormal weight loss: Secondary | ICD-10-CM | POA: Diagnosis not present

## 2021-07-29 DIAGNOSIS — Z681 Body mass index (BMI) 19 or less, adult: Secondary | ICD-10-CM | POA: Diagnosis not present

## 2021-07-29 DIAGNOSIS — F0284 Dementia in other diseases classified elsewhere, unspecified severity, with anxiety: Secondary | ICD-10-CM | POA: Diagnosis not present

## 2021-07-29 DIAGNOSIS — E43 Unspecified severe protein-calorie malnutrition: Secondary | ICD-10-CM | POA: Diagnosis not present

## 2021-07-29 DIAGNOSIS — F02811 Dementia in other diseases classified elsewhere, unspecified severity, with agitation: Secondary | ICD-10-CM | POA: Diagnosis not present

## 2021-07-31 DIAGNOSIS — G301 Alzheimer's disease with late onset: Secondary | ICD-10-CM | POA: Diagnosis not present

## 2021-07-31 DIAGNOSIS — F02811 Dementia in other diseases classified elsewhere, unspecified severity, with agitation: Secondary | ICD-10-CM | POA: Diagnosis not present

## 2021-07-31 DIAGNOSIS — R634 Abnormal weight loss: Secondary | ICD-10-CM | POA: Diagnosis not present

## 2021-07-31 DIAGNOSIS — F0284 Dementia in other diseases classified elsewhere, unspecified severity, with anxiety: Secondary | ICD-10-CM | POA: Diagnosis not present

## 2021-07-31 DIAGNOSIS — E43 Unspecified severe protein-calorie malnutrition: Secondary | ICD-10-CM | POA: Diagnosis not present

## 2021-07-31 DIAGNOSIS — Z681 Body mass index (BMI) 19 or less, adult: Secondary | ICD-10-CM | POA: Diagnosis not present

## 2021-08-04 DIAGNOSIS — F02811 Dementia in other diseases classified elsewhere, unspecified severity, with agitation: Secondary | ICD-10-CM | POA: Diagnosis not present

## 2021-08-04 DIAGNOSIS — Z681 Body mass index (BMI) 19 or less, adult: Secondary | ICD-10-CM | POA: Diagnosis not present

## 2021-08-04 DIAGNOSIS — G301 Alzheimer's disease with late onset: Secondary | ICD-10-CM | POA: Diagnosis not present

## 2021-08-04 DIAGNOSIS — E43 Unspecified severe protein-calorie malnutrition: Secondary | ICD-10-CM | POA: Diagnosis not present

## 2021-08-04 DIAGNOSIS — R634 Abnormal weight loss: Secondary | ICD-10-CM | POA: Diagnosis not present

## 2021-08-04 DIAGNOSIS — F0284 Dementia in other diseases classified elsewhere, unspecified severity, with anxiety: Secondary | ICD-10-CM | POA: Diagnosis not present

## 2021-08-05 DIAGNOSIS — R634 Abnormal weight loss: Secondary | ICD-10-CM | POA: Diagnosis not present

## 2021-08-05 DIAGNOSIS — G301 Alzheimer's disease with late onset: Secondary | ICD-10-CM | POA: Diagnosis not present

## 2021-08-05 DIAGNOSIS — E43 Unspecified severe protein-calorie malnutrition: Secondary | ICD-10-CM | POA: Diagnosis not present

## 2021-08-05 DIAGNOSIS — F02811 Dementia in other diseases classified elsewhere, unspecified severity, with agitation: Secondary | ICD-10-CM | POA: Diagnosis not present

## 2021-08-05 DIAGNOSIS — F0284 Dementia in other diseases classified elsewhere, unspecified severity, with anxiety: Secondary | ICD-10-CM | POA: Diagnosis not present

## 2021-08-05 DIAGNOSIS — Z681 Body mass index (BMI) 19 or less, adult: Secondary | ICD-10-CM | POA: Diagnosis not present

## 2021-08-07 DIAGNOSIS — F0284 Dementia in other diseases classified elsewhere, unspecified severity, with anxiety: Secondary | ICD-10-CM | POA: Diagnosis not present

## 2021-08-07 DIAGNOSIS — E43 Unspecified severe protein-calorie malnutrition: Secondary | ICD-10-CM | POA: Diagnosis not present

## 2021-08-07 DIAGNOSIS — F02811 Dementia in other diseases classified elsewhere, unspecified severity, with agitation: Secondary | ICD-10-CM | POA: Diagnosis not present

## 2021-08-07 DIAGNOSIS — Z681 Body mass index (BMI) 19 or less, adult: Secondary | ICD-10-CM | POA: Diagnosis not present

## 2021-08-07 DIAGNOSIS — R634 Abnormal weight loss: Secondary | ICD-10-CM | POA: Diagnosis not present

## 2021-08-07 DIAGNOSIS — G301 Alzheimer's disease with late onset: Secondary | ICD-10-CM | POA: Diagnosis not present

## 2021-08-09 DIAGNOSIS — R410 Disorientation, unspecified: Secondary | ICD-10-CM | POA: Diagnosis not present

## 2021-08-09 DIAGNOSIS — Z853 Personal history of malignant neoplasm of breast: Secondary | ICD-10-CM | POA: Diagnosis not present

## 2021-08-09 DIAGNOSIS — Z8679 Personal history of other diseases of the circulatory system: Secondary | ICD-10-CM | POA: Diagnosis not present

## 2021-08-09 DIAGNOSIS — E43 Unspecified severe protein-calorie malnutrition: Secondary | ICD-10-CM | POA: Diagnosis not present

## 2021-08-09 DIAGNOSIS — F0284 Dementia in other diseases classified elsewhere, unspecified severity, with anxiety: Secondary | ICD-10-CM | POA: Diagnosis not present

## 2021-08-09 DIAGNOSIS — G301 Alzheimer's disease with late onset: Secondary | ICD-10-CM | POA: Diagnosis not present

## 2021-08-09 DIAGNOSIS — Z8616 Personal history of COVID-19: Secondary | ICD-10-CM | POA: Diagnosis not present

## 2021-08-09 DIAGNOSIS — Z681 Body mass index (BMI) 19 or less, adult: Secondary | ICD-10-CM | POA: Diagnosis not present

## 2021-08-09 DIAGNOSIS — R634 Abnormal weight loss: Secondary | ICD-10-CM | POA: Diagnosis not present

## 2021-08-09 DIAGNOSIS — F32A Depression, unspecified: Secondary | ICD-10-CM | POA: Diagnosis not present

## 2021-08-09 DIAGNOSIS — F02811 Dementia in other diseases classified elsewhere, unspecified severity, with agitation: Secondary | ICD-10-CM | POA: Diagnosis not present

## 2021-08-11 DIAGNOSIS — E43 Unspecified severe protein-calorie malnutrition: Secondary | ICD-10-CM | POA: Diagnosis not present

## 2021-08-11 DIAGNOSIS — R634 Abnormal weight loss: Secondary | ICD-10-CM | POA: Diagnosis not present

## 2021-08-11 DIAGNOSIS — F02811 Dementia in other diseases classified elsewhere, unspecified severity, with agitation: Secondary | ICD-10-CM | POA: Diagnosis not present

## 2021-08-11 DIAGNOSIS — G301 Alzheimer's disease with late onset: Secondary | ICD-10-CM | POA: Diagnosis not present

## 2021-08-11 DIAGNOSIS — F0284 Dementia in other diseases classified elsewhere, unspecified severity, with anxiety: Secondary | ICD-10-CM | POA: Diagnosis not present

## 2021-08-11 DIAGNOSIS — Z681 Body mass index (BMI) 19 or less, adult: Secondary | ICD-10-CM | POA: Diagnosis not present

## 2021-08-12 DIAGNOSIS — E43 Unspecified severe protein-calorie malnutrition: Secondary | ICD-10-CM | POA: Diagnosis not present

## 2021-08-12 DIAGNOSIS — F02811 Dementia in other diseases classified elsewhere, unspecified severity, with agitation: Secondary | ICD-10-CM | POA: Diagnosis not present

## 2021-08-12 DIAGNOSIS — Z681 Body mass index (BMI) 19 or less, adult: Secondary | ICD-10-CM | POA: Diagnosis not present

## 2021-08-12 DIAGNOSIS — F0284 Dementia in other diseases classified elsewhere, unspecified severity, with anxiety: Secondary | ICD-10-CM | POA: Diagnosis not present

## 2021-08-12 DIAGNOSIS — R634 Abnormal weight loss: Secondary | ICD-10-CM | POA: Diagnosis not present

## 2021-08-12 DIAGNOSIS — G301 Alzheimer's disease with late onset: Secondary | ICD-10-CM | POA: Diagnosis not present

## 2021-08-14 DIAGNOSIS — F0284 Dementia in other diseases classified elsewhere, unspecified severity, with anxiety: Secondary | ICD-10-CM | POA: Diagnosis not present

## 2021-08-14 DIAGNOSIS — F02811 Dementia in other diseases classified elsewhere, unspecified severity, with agitation: Secondary | ICD-10-CM | POA: Diagnosis not present

## 2021-08-14 DIAGNOSIS — G301 Alzheimer's disease with late onset: Secondary | ICD-10-CM | POA: Diagnosis not present

## 2021-08-14 DIAGNOSIS — E43 Unspecified severe protein-calorie malnutrition: Secondary | ICD-10-CM | POA: Diagnosis not present

## 2021-08-14 DIAGNOSIS — R634 Abnormal weight loss: Secondary | ICD-10-CM | POA: Diagnosis not present

## 2021-08-14 DIAGNOSIS — Z681 Body mass index (BMI) 19 or less, adult: Secondary | ICD-10-CM | POA: Diagnosis not present

## 2021-08-17 DIAGNOSIS — F0284 Dementia in other diseases classified elsewhere, unspecified severity, with anxiety: Secondary | ICD-10-CM | POA: Diagnosis not present

## 2021-08-17 DIAGNOSIS — R634 Abnormal weight loss: Secondary | ICD-10-CM | POA: Diagnosis not present

## 2021-08-17 DIAGNOSIS — Z681 Body mass index (BMI) 19 or less, adult: Secondary | ICD-10-CM | POA: Diagnosis not present

## 2021-08-17 DIAGNOSIS — E43 Unspecified severe protein-calorie malnutrition: Secondary | ICD-10-CM | POA: Diagnosis not present

## 2021-08-17 DIAGNOSIS — G301 Alzheimer's disease with late onset: Secondary | ICD-10-CM | POA: Diagnosis not present

## 2021-08-17 DIAGNOSIS — F02811 Dementia in other diseases classified elsewhere, unspecified severity, with agitation: Secondary | ICD-10-CM | POA: Diagnosis not present

## 2021-08-20 DIAGNOSIS — E43 Unspecified severe protein-calorie malnutrition: Secondary | ICD-10-CM | POA: Diagnosis not present

## 2021-08-20 DIAGNOSIS — Z681 Body mass index (BMI) 19 or less, adult: Secondary | ICD-10-CM | POA: Diagnosis not present

## 2021-08-20 DIAGNOSIS — F0284 Dementia in other diseases classified elsewhere, unspecified severity, with anxiety: Secondary | ICD-10-CM | POA: Diagnosis not present

## 2021-08-20 DIAGNOSIS — G301 Alzheimer's disease with late onset: Secondary | ICD-10-CM | POA: Diagnosis not present

## 2021-08-20 DIAGNOSIS — F02811 Dementia in other diseases classified elsewhere, unspecified severity, with agitation: Secondary | ICD-10-CM | POA: Diagnosis not present

## 2021-08-20 DIAGNOSIS — R634 Abnormal weight loss: Secondary | ICD-10-CM | POA: Diagnosis not present

## 2021-08-24 DIAGNOSIS — G301 Alzheimer's disease with late onset: Secondary | ICD-10-CM | POA: Diagnosis not present

## 2021-08-24 DIAGNOSIS — E43 Unspecified severe protein-calorie malnutrition: Secondary | ICD-10-CM | POA: Diagnosis not present

## 2021-08-24 DIAGNOSIS — F02811 Dementia in other diseases classified elsewhere, unspecified severity, with agitation: Secondary | ICD-10-CM | POA: Diagnosis not present

## 2021-08-24 DIAGNOSIS — F0284 Dementia in other diseases classified elsewhere, unspecified severity, with anxiety: Secondary | ICD-10-CM | POA: Diagnosis not present

## 2021-08-24 DIAGNOSIS — Z681 Body mass index (BMI) 19 or less, adult: Secondary | ICD-10-CM | POA: Diagnosis not present

## 2021-08-24 DIAGNOSIS — R634 Abnormal weight loss: Secondary | ICD-10-CM | POA: Diagnosis not present

## 2021-08-26 DIAGNOSIS — G301 Alzheimer's disease with late onset: Secondary | ICD-10-CM | POA: Diagnosis not present

## 2021-08-26 DIAGNOSIS — F0284 Dementia in other diseases classified elsewhere, unspecified severity, with anxiety: Secondary | ICD-10-CM | POA: Diagnosis not present

## 2021-08-26 DIAGNOSIS — R634 Abnormal weight loss: Secondary | ICD-10-CM | POA: Diagnosis not present

## 2021-08-26 DIAGNOSIS — E43 Unspecified severe protein-calorie malnutrition: Secondary | ICD-10-CM | POA: Diagnosis not present

## 2021-08-26 DIAGNOSIS — F02811 Dementia in other diseases classified elsewhere, unspecified severity, with agitation: Secondary | ICD-10-CM | POA: Diagnosis not present

## 2021-08-26 DIAGNOSIS — Z681 Body mass index (BMI) 19 or less, adult: Secondary | ICD-10-CM | POA: Diagnosis not present

## 2021-08-27 DIAGNOSIS — R634 Abnormal weight loss: Secondary | ICD-10-CM | POA: Diagnosis not present

## 2021-08-27 DIAGNOSIS — F02811 Dementia in other diseases classified elsewhere, unspecified severity, with agitation: Secondary | ICD-10-CM | POA: Diagnosis not present

## 2021-08-27 DIAGNOSIS — Z681 Body mass index (BMI) 19 or less, adult: Secondary | ICD-10-CM | POA: Diagnosis not present

## 2021-08-27 DIAGNOSIS — E43 Unspecified severe protein-calorie malnutrition: Secondary | ICD-10-CM | POA: Diagnosis not present

## 2021-08-27 DIAGNOSIS — G301 Alzheimer's disease with late onset: Secondary | ICD-10-CM | POA: Diagnosis not present

## 2021-08-27 DIAGNOSIS — F0284 Dementia in other diseases classified elsewhere, unspecified severity, with anxiety: Secondary | ICD-10-CM | POA: Diagnosis not present

## 2021-08-28 DIAGNOSIS — R634 Abnormal weight loss: Secondary | ICD-10-CM | POA: Diagnosis not present

## 2021-08-28 DIAGNOSIS — G301 Alzheimer's disease with late onset: Secondary | ICD-10-CM | POA: Diagnosis not present

## 2021-08-28 DIAGNOSIS — Z681 Body mass index (BMI) 19 or less, adult: Secondary | ICD-10-CM | POA: Diagnosis not present

## 2021-08-28 DIAGNOSIS — F02811 Dementia in other diseases classified elsewhere, unspecified severity, with agitation: Secondary | ICD-10-CM | POA: Diagnosis not present

## 2021-08-28 DIAGNOSIS — F0284 Dementia in other diseases classified elsewhere, unspecified severity, with anxiety: Secondary | ICD-10-CM | POA: Diagnosis not present

## 2021-08-28 DIAGNOSIS — E43 Unspecified severe protein-calorie malnutrition: Secondary | ICD-10-CM | POA: Diagnosis not present

## 2021-08-30 DIAGNOSIS — F0284 Dementia in other diseases classified elsewhere, unspecified severity, with anxiety: Secondary | ICD-10-CM | POA: Diagnosis not present

## 2021-08-30 DIAGNOSIS — G301 Alzheimer's disease with late onset: Secondary | ICD-10-CM | POA: Diagnosis not present

## 2021-08-30 DIAGNOSIS — E43 Unspecified severe protein-calorie malnutrition: Secondary | ICD-10-CM | POA: Diagnosis not present

## 2021-08-30 DIAGNOSIS — F02811 Dementia in other diseases classified elsewhere, unspecified severity, with agitation: Secondary | ICD-10-CM | POA: Diagnosis not present

## 2021-08-30 DIAGNOSIS — Z681 Body mass index (BMI) 19 or less, adult: Secondary | ICD-10-CM | POA: Diagnosis not present

## 2021-08-30 DIAGNOSIS — R634 Abnormal weight loss: Secondary | ICD-10-CM | POA: Diagnosis not present

## 2021-08-31 DIAGNOSIS — E43 Unspecified severe protein-calorie malnutrition: Secondary | ICD-10-CM | POA: Diagnosis not present

## 2021-08-31 DIAGNOSIS — F0284 Dementia in other diseases classified elsewhere, unspecified severity, with anxiety: Secondary | ICD-10-CM | POA: Diagnosis not present

## 2021-08-31 DIAGNOSIS — G301 Alzheimer's disease with late onset: Secondary | ICD-10-CM | POA: Diagnosis not present

## 2021-08-31 DIAGNOSIS — R634 Abnormal weight loss: Secondary | ICD-10-CM | POA: Diagnosis not present

## 2021-08-31 DIAGNOSIS — F02811 Dementia in other diseases classified elsewhere, unspecified severity, with agitation: Secondary | ICD-10-CM | POA: Diagnosis not present

## 2021-08-31 DIAGNOSIS — Z681 Body mass index (BMI) 19 or less, adult: Secondary | ICD-10-CM | POA: Diagnosis not present

## 2021-09-01 DIAGNOSIS — F02811 Dementia in other diseases classified elsewhere, unspecified severity, with agitation: Secondary | ICD-10-CM | POA: Diagnosis not present

## 2021-09-01 DIAGNOSIS — R634 Abnormal weight loss: Secondary | ICD-10-CM | POA: Diagnosis not present

## 2021-09-01 DIAGNOSIS — Z681 Body mass index (BMI) 19 or less, adult: Secondary | ICD-10-CM | POA: Diagnosis not present

## 2021-09-01 DIAGNOSIS — F0284 Dementia in other diseases classified elsewhere, unspecified severity, with anxiety: Secondary | ICD-10-CM | POA: Diagnosis not present

## 2021-09-01 DIAGNOSIS — E43 Unspecified severe protein-calorie malnutrition: Secondary | ICD-10-CM | POA: Diagnosis not present

## 2021-09-01 DIAGNOSIS — G301 Alzheimer's disease with late onset: Secondary | ICD-10-CM | POA: Diagnosis not present

## 2021-09-03 DIAGNOSIS — G301 Alzheimer's disease with late onset: Secondary | ICD-10-CM | POA: Diagnosis not present

## 2021-09-03 DIAGNOSIS — Z681 Body mass index (BMI) 19 or less, adult: Secondary | ICD-10-CM | POA: Diagnosis not present

## 2021-09-03 DIAGNOSIS — E43 Unspecified severe protein-calorie malnutrition: Secondary | ICD-10-CM | POA: Diagnosis not present

## 2021-09-03 DIAGNOSIS — R634 Abnormal weight loss: Secondary | ICD-10-CM | POA: Diagnosis not present

## 2021-09-03 DIAGNOSIS — F02811 Dementia in other diseases classified elsewhere, unspecified severity, with agitation: Secondary | ICD-10-CM | POA: Diagnosis not present

## 2021-09-03 DIAGNOSIS — F0284 Dementia in other diseases classified elsewhere, unspecified severity, with anxiety: Secondary | ICD-10-CM | POA: Diagnosis not present

## 2021-09-07 DIAGNOSIS — R634 Abnormal weight loss: Secondary | ICD-10-CM | POA: Diagnosis not present

## 2021-09-07 DIAGNOSIS — E43 Unspecified severe protein-calorie malnutrition: Secondary | ICD-10-CM | POA: Diagnosis not present

## 2021-09-07 DIAGNOSIS — Z681 Body mass index (BMI) 19 or less, adult: Secondary | ICD-10-CM | POA: Diagnosis not present

## 2021-09-07 DIAGNOSIS — G301 Alzheimer's disease with late onset: Secondary | ICD-10-CM | POA: Diagnosis not present

## 2021-09-07 DIAGNOSIS — F02811 Dementia in other diseases classified elsewhere, unspecified severity, with agitation: Secondary | ICD-10-CM | POA: Diagnosis not present

## 2021-09-07 DIAGNOSIS — F0284 Dementia in other diseases classified elsewhere, unspecified severity, with anxiety: Secondary | ICD-10-CM | POA: Diagnosis not present

## 2021-09-09 DIAGNOSIS — G301 Alzheimer's disease with late onset: Secondary | ICD-10-CM | POA: Diagnosis not present

## 2021-09-09 DIAGNOSIS — Z8616 Personal history of COVID-19: Secondary | ICD-10-CM | POA: Diagnosis not present

## 2021-09-09 DIAGNOSIS — F32A Depression, unspecified: Secondary | ICD-10-CM | POA: Diagnosis not present

## 2021-09-09 DIAGNOSIS — R634 Abnormal weight loss: Secondary | ICD-10-CM | POA: Diagnosis not present

## 2021-09-09 DIAGNOSIS — F0284 Dementia in other diseases classified elsewhere, unspecified severity, with anxiety: Secondary | ICD-10-CM | POA: Diagnosis not present

## 2021-09-09 DIAGNOSIS — E43 Unspecified severe protein-calorie malnutrition: Secondary | ICD-10-CM | POA: Diagnosis not present

## 2021-09-09 DIAGNOSIS — Z853 Personal history of malignant neoplasm of breast: Secondary | ICD-10-CM | POA: Diagnosis not present

## 2021-09-09 DIAGNOSIS — R410 Disorientation, unspecified: Secondary | ICD-10-CM | POA: Diagnosis not present

## 2021-09-09 DIAGNOSIS — Z681 Body mass index (BMI) 19 or less, adult: Secondary | ICD-10-CM | POA: Diagnosis not present

## 2021-09-09 DIAGNOSIS — F02811 Dementia in other diseases classified elsewhere, unspecified severity, with agitation: Secondary | ICD-10-CM | POA: Diagnosis not present

## 2021-09-09 DIAGNOSIS — Z8679 Personal history of other diseases of the circulatory system: Secondary | ICD-10-CM | POA: Diagnosis not present

## 2021-09-10 DIAGNOSIS — Z681 Body mass index (BMI) 19 or less, adult: Secondary | ICD-10-CM | POA: Diagnosis not present

## 2021-09-10 DIAGNOSIS — R634 Abnormal weight loss: Secondary | ICD-10-CM | POA: Diagnosis not present

## 2021-09-10 DIAGNOSIS — G301 Alzheimer's disease with late onset: Secondary | ICD-10-CM | POA: Diagnosis not present

## 2021-09-10 DIAGNOSIS — F02811 Dementia in other diseases classified elsewhere, unspecified severity, with agitation: Secondary | ICD-10-CM | POA: Diagnosis not present

## 2021-09-10 DIAGNOSIS — E43 Unspecified severe protein-calorie malnutrition: Secondary | ICD-10-CM | POA: Diagnosis not present

## 2021-09-10 DIAGNOSIS — F0284 Dementia in other diseases classified elsewhere, unspecified severity, with anxiety: Secondary | ICD-10-CM | POA: Diagnosis not present

## 2021-09-11 DIAGNOSIS — G301 Alzheimer's disease with late onset: Secondary | ICD-10-CM | POA: Diagnosis not present

## 2021-09-11 DIAGNOSIS — R634 Abnormal weight loss: Secondary | ICD-10-CM | POA: Diagnosis not present

## 2021-09-11 DIAGNOSIS — F02811 Dementia in other diseases classified elsewhere, unspecified severity, with agitation: Secondary | ICD-10-CM | POA: Diagnosis not present

## 2021-09-11 DIAGNOSIS — F0284 Dementia in other diseases classified elsewhere, unspecified severity, with anxiety: Secondary | ICD-10-CM | POA: Diagnosis not present

## 2021-09-11 DIAGNOSIS — E43 Unspecified severe protein-calorie malnutrition: Secondary | ICD-10-CM | POA: Diagnosis not present

## 2021-09-11 DIAGNOSIS — Z681 Body mass index (BMI) 19 or less, adult: Secondary | ICD-10-CM | POA: Diagnosis not present

## 2021-09-14 DIAGNOSIS — R634 Abnormal weight loss: Secondary | ICD-10-CM | POA: Diagnosis not present

## 2021-09-14 DIAGNOSIS — F02811 Dementia in other diseases classified elsewhere, unspecified severity, with agitation: Secondary | ICD-10-CM | POA: Diagnosis not present

## 2021-09-14 DIAGNOSIS — E43 Unspecified severe protein-calorie malnutrition: Secondary | ICD-10-CM | POA: Diagnosis not present

## 2021-09-14 DIAGNOSIS — G301 Alzheimer's disease with late onset: Secondary | ICD-10-CM | POA: Diagnosis not present

## 2021-09-14 DIAGNOSIS — Z681 Body mass index (BMI) 19 or less, adult: Secondary | ICD-10-CM | POA: Diagnosis not present

## 2021-09-14 DIAGNOSIS — F0284 Dementia in other diseases classified elsewhere, unspecified severity, with anxiety: Secondary | ICD-10-CM | POA: Diagnosis not present

## 2021-09-17 DIAGNOSIS — R634 Abnormal weight loss: Secondary | ICD-10-CM | POA: Diagnosis not present

## 2021-09-17 DIAGNOSIS — Z681 Body mass index (BMI) 19 or less, adult: Secondary | ICD-10-CM | POA: Diagnosis not present

## 2021-09-17 DIAGNOSIS — G301 Alzheimer's disease with late onset: Secondary | ICD-10-CM | POA: Diagnosis not present

## 2021-09-17 DIAGNOSIS — F0284 Dementia in other diseases classified elsewhere, unspecified severity, with anxiety: Secondary | ICD-10-CM | POA: Diagnosis not present

## 2021-09-17 DIAGNOSIS — E43 Unspecified severe protein-calorie malnutrition: Secondary | ICD-10-CM | POA: Diagnosis not present

## 2021-09-17 DIAGNOSIS — F02811 Dementia in other diseases classified elsewhere, unspecified severity, with agitation: Secondary | ICD-10-CM | POA: Diagnosis not present

## 2021-09-21 DIAGNOSIS — Z681 Body mass index (BMI) 19 or less, adult: Secondary | ICD-10-CM | POA: Diagnosis not present

## 2021-09-21 DIAGNOSIS — R634 Abnormal weight loss: Secondary | ICD-10-CM | POA: Diagnosis not present

## 2021-09-21 DIAGNOSIS — G301 Alzheimer's disease with late onset: Secondary | ICD-10-CM | POA: Diagnosis not present

## 2021-09-21 DIAGNOSIS — F02811 Dementia in other diseases classified elsewhere, unspecified severity, with agitation: Secondary | ICD-10-CM | POA: Diagnosis not present

## 2021-09-21 DIAGNOSIS — E43 Unspecified severe protein-calorie malnutrition: Secondary | ICD-10-CM | POA: Diagnosis not present

## 2021-09-21 DIAGNOSIS — F0284 Dementia in other diseases classified elsewhere, unspecified severity, with anxiety: Secondary | ICD-10-CM | POA: Diagnosis not present

## 2021-09-24 DIAGNOSIS — E43 Unspecified severe protein-calorie malnutrition: Secondary | ICD-10-CM | POA: Diagnosis not present

## 2021-09-24 DIAGNOSIS — G301 Alzheimer's disease with late onset: Secondary | ICD-10-CM | POA: Diagnosis not present

## 2021-09-24 DIAGNOSIS — R634 Abnormal weight loss: Secondary | ICD-10-CM | POA: Diagnosis not present

## 2021-09-24 DIAGNOSIS — F0284 Dementia in other diseases classified elsewhere, unspecified severity, with anxiety: Secondary | ICD-10-CM | POA: Diagnosis not present

## 2021-09-24 DIAGNOSIS — Z681 Body mass index (BMI) 19 or less, adult: Secondary | ICD-10-CM | POA: Diagnosis not present

## 2021-09-24 DIAGNOSIS — F02811 Dementia in other diseases classified elsewhere, unspecified severity, with agitation: Secondary | ICD-10-CM | POA: Diagnosis not present

## 2021-09-28 DIAGNOSIS — R634 Abnormal weight loss: Secondary | ICD-10-CM | POA: Diagnosis not present

## 2021-09-28 DIAGNOSIS — E43 Unspecified severe protein-calorie malnutrition: Secondary | ICD-10-CM | POA: Diagnosis not present

## 2021-09-28 DIAGNOSIS — Z681 Body mass index (BMI) 19 or less, adult: Secondary | ICD-10-CM | POA: Diagnosis not present

## 2021-09-28 DIAGNOSIS — G301 Alzheimer's disease with late onset: Secondary | ICD-10-CM | POA: Diagnosis not present

## 2021-09-28 DIAGNOSIS — F02811 Dementia in other diseases classified elsewhere, unspecified severity, with agitation: Secondary | ICD-10-CM | POA: Diagnosis not present

## 2021-09-28 DIAGNOSIS — F0284 Dementia in other diseases classified elsewhere, unspecified severity, with anxiety: Secondary | ICD-10-CM | POA: Diagnosis not present

## 2021-09-29 DIAGNOSIS — Z681 Body mass index (BMI) 19 or less, adult: Secondary | ICD-10-CM | POA: Diagnosis not present

## 2021-09-29 DIAGNOSIS — F0284 Dementia in other diseases classified elsewhere, unspecified severity, with anxiety: Secondary | ICD-10-CM | POA: Diagnosis not present

## 2021-09-29 DIAGNOSIS — E43 Unspecified severe protein-calorie malnutrition: Secondary | ICD-10-CM | POA: Diagnosis not present

## 2021-09-29 DIAGNOSIS — R634 Abnormal weight loss: Secondary | ICD-10-CM | POA: Diagnosis not present

## 2021-09-29 DIAGNOSIS — G301 Alzheimer's disease with late onset: Secondary | ICD-10-CM | POA: Diagnosis not present

## 2021-09-29 DIAGNOSIS — F02811 Dementia in other diseases classified elsewhere, unspecified severity, with agitation: Secondary | ICD-10-CM | POA: Diagnosis not present

## 2021-10-01 DIAGNOSIS — E43 Unspecified severe protein-calorie malnutrition: Secondary | ICD-10-CM | POA: Diagnosis not present

## 2021-10-01 DIAGNOSIS — G301 Alzheimer's disease with late onset: Secondary | ICD-10-CM | POA: Diagnosis not present

## 2021-10-01 DIAGNOSIS — R634 Abnormal weight loss: Secondary | ICD-10-CM | POA: Diagnosis not present

## 2021-10-01 DIAGNOSIS — Z681 Body mass index (BMI) 19 or less, adult: Secondary | ICD-10-CM | POA: Diagnosis not present

## 2021-10-01 DIAGNOSIS — F02811 Dementia in other diseases classified elsewhere, unspecified severity, with agitation: Secondary | ICD-10-CM | POA: Diagnosis not present

## 2021-10-01 DIAGNOSIS — F0284 Dementia in other diseases classified elsewhere, unspecified severity, with anxiety: Secondary | ICD-10-CM | POA: Diagnosis not present

## 2021-10-02 DIAGNOSIS — E43 Unspecified severe protein-calorie malnutrition: Secondary | ICD-10-CM | POA: Diagnosis not present

## 2021-10-02 DIAGNOSIS — F0284 Dementia in other diseases classified elsewhere, unspecified severity, with anxiety: Secondary | ICD-10-CM | POA: Diagnosis not present

## 2021-10-02 DIAGNOSIS — Z681 Body mass index (BMI) 19 or less, adult: Secondary | ICD-10-CM | POA: Diagnosis not present

## 2021-10-02 DIAGNOSIS — F02811 Dementia in other diseases classified elsewhere, unspecified severity, with agitation: Secondary | ICD-10-CM | POA: Diagnosis not present

## 2021-10-02 DIAGNOSIS — G301 Alzheimer's disease with late onset: Secondary | ICD-10-CM | POA: Diagnosis not present

## 2021-10-02 DIAGNOSIS — R634 Abnormal weight loss: Secondary | ICD-10-CM | POA: Diagnosis not present

## 2021-10-05 DIAGNOSIS — F0284 Dementia in other diseases classified elsewhere, unspecified severity, with anxiety: Secondary | ICD-10-CM | POA: Diagnosis not present

## 2021-10-05 DIAGNOSIS — E43 Unspecified severe protein-calorie malnutrition: Secondary | ICD-10-CM | POA: Diagnosis not present

## 2021-10-05 DIAGNOSIS — Z681 Body mass index (BMI) 19 or less, adult: Secondary | ICD-10-CM | POA: Diagnosis not present

## 2021-10-05 DIAGNOSIS — G301 Alzheimer's disease with late onset: Secondary | ICD-10-CM | POA: Diagnosis not present

## 2021-10-05 DIAGNOSIS — F02811 Dementia in other diseases classified elsewhere, unspecified severity, with agitation: Secondary | ICD-10-CM | POA: Diagnosis not present

## 2021-10-05 DIAGNOSIS — R634 Abnormal weight loss: Secondary | ICD-10-CM | POA: Diagnosis not present

## 2021-10-07 DIAGNOSIS — Z8679 Personal history of other diseases of the circulatory system: Secondary | ICD-10-CM | POA: Diagnosis not present

## 2021-10-07 DIAGNOSIS — F02811 Dementia in other diseases classified elsewhere, unspecified severity, with agitation: Secondary | ICD-10-CM | POA: Diagnosis not present

## 2021-10-07 DIAGNOSIS — F0284 Dementia in other diseases classified elsewhere, unspecified severity, with anxiety: Secondary | ICD-10-CM | POA: Diagnosis not present

## 2021-10-07 DIAGNOSIS — G301 Alzheimer's disease with late onset: Secondary | ICD-10-CM | POA: Diagnosis not present

## 2021-10-07 DIAGNOSIS — R634 Abnormal weight loss: Secondary | ICD-10-CM | POA: Diagnosis not present

## 2021-10-07 DIAGNOSIS — E43 Unspecified severe protein-calorie malnutrition: Secondary | ICD-10-CM | POA: Diagnosis not present

## 2021-10-07 DIAGNOSIS — R410 Disorientation, unspecified: Secondary | ICD-10-CM | POA: Diagnosis not present

## 2021-10-07 DIAGNOSIS — Z681 Body mass index (BMI) 19 or less, adult: Secondary | ICD-10-CM | POA: Diagnosis not present

## 2021-10-07 DIAGNOSIS — F32A Depression, unspecified: Secondary | ICD-10-CM | POA: Diagnosis not present

## 2021-10-07 DIAGNOSIS — Z8616 Personal history of COVID-19: Secondary | ICD-10-CM | POA: Diagnosis not present

## 2021-10-07 DIAGNOSIS — Z853 Personal history of malignant neoplasm of breast: Secondary | ICD-10-CM | POA: Diagnosis not present

## 2021-10-08 DIAGNOSIS — Z681 Body mass index (BMI) 19 or less, adult: Secondary | ICD-10-CM | POA: Diagnosis not present

## 2021-10-08 DIAGNOSIS — G301 Alzheimer's disease with late onset: Secondary | ICD-10-CM | POA: Diagnosis not present

## 2021-10-08 DIAGNOSIS — F0284 Dementia in other diseases classified elsewhere, unspecified severity, with anxiety: Secondary | ICD-10-CM | POA: Diagnosis not present

## 2021-10-08 DIAGNOSIS — F02811 Dementia in other diseases classified elsewhere, unspecified severity, with agitation: Secondary | ICD-10-CM | POA: Diagnosis not present

## 2021-10-08 DIAGNOSIS — E43 Unspecified severe protein-calorie malnutrition: Secondary | ICD-10-CM | POA: Diagnosis not present

## 2021-10-08 DIAGNOSIS — R634 Abnormal weight loss: Secondary | ICD-10-CM | POA: Diagnosis not present

## 2021-10-10 DIAGNOSIS — F02811 Dementia in other diseases classified elsewhere, unspecified severity, with agitation: Secondary | ICD-10-CM | POA: Diagnosis not present

## 2021-10-10 DIAGNOSIS — F0284 Dementia in other diseases classified elsewhere, unspecified severity, with anxiety: Secondary | ICD-10-CM | POA: Diagnosis not present

## 2021-10-10 DIAGNOSIS — E43 Unspecified severe protein-calorie malnutrition: Secondary | ICD-10-CM | POA: Diagnosis not present

## 2021-10-10 DIAGNOSIS — Z681 Body mass index (BMI) 19 or less, adult: Secondary | ICD-10-CM | POA: Diagnosis not present

## 2021-10-10 DIAGNOSIS — G301 Alzheimer's disease with late onset: Secondary | ICD-10-CM | POA: Diagnosis not present

## 2021-10-10 DIAGNOSIS — R634 Abnormal weight loss: Secondary | ICD-10-CM | POA: Diagnosis not present

## 2021-10-12 DIAGNOSIS — R634 Abnormal weight loss: Secondary | ICD-10-CM | POA: Diagnosis not present

## 2021-10-12 DIAGNOSIS — F0284 Dementia in other diseases classified elsewhere, unspecified severity, with anxiety: Secondary | ICD-10-CM | POA: Diagnosis not present

## 2021-10-12 DIAGNOSIS — F02811 Dementia in other diseases classified elsewhere, unspecified severity, with agitation: Secondary | ICD-10-CM | POA: Diagnosis not present

## 2021-10-12 DIAGNOSIS — Z681 Body mass index (BMI) 19 or less, adult: Secondary | ICD-10-CM | POA: Diagnosis not present

## 2021-10-12 DIAGNOSIS — E43 Unspecified severe protein-calorie malnutrition: Secondary | ICD-10-CM | POA: Diagnosis not present

## 2021-10-12 DIAGNOSIS — G301 Alzheimer's disease with late onset: Secondary | ICD-10-CM | POA: Diagnosis not present

## 2021-10-15 DIAGNOSIS — F0284 Dementia in other diseases classified elsewhere, unspecified severity, with anxiety: Secondary | ICD-10-CM | POA: Diagnosis not present

## 2021-10-15 DIAGNOSIS — G301 Alzheimer's disease with late onset: Secondary | ICD-10-CM | POA: Diagnosis not present

## 2021-10-15 DIAGNOSIS — Z681 Body mass index (BMI) 19 or less, adult: Secondary | ICD-10-CM | POA: Diagnosis not present

## 2021-10-15 DIAGNOSIS — R634 Abnormal weight loss: Secondary | ICD-10-CM | POA: Diagnosis not present

## 2021-10-15 DIAGNOSIS — F02811 Dementia in other diseases classified elsewhere, unspecified severity, with agitation: Secondary | ICD-10-CM | POA: Diagnosis not present

## 2021-10-15 DIAGNOSIS — E43 Unspecified severe protein-calorie malnutrition: Secondary | ICD-10-CM | POA: Diagnosis not present

## 2021-10-18 DIAGNOSIS — F0284 Dementia in other diseases classified elsewhere, unspecified severity, with anxiety: Secondary | ICD-10-CM | POA: Diagnosis not present

## 2021-10-18 DIAGNOSIS — Z681 Body mass index (BMI) 19 or less, adult: Secondary | ICD-10-CM | POA: Diagnosis not present

## 2021-10-18 DIAGNOSIS — F02811 Dementia in other diseases classified elsewhere, unspecified severity, with agitation: Secondary | ICD-10-CM | POA: Diagnosis not present

## 2021-10-18 DIAGNOSIS — G301 Alzheimer's disease with late onset: Secondary | ICD-10-CM | POA: Diagnosis not present

## 2021-10-18 DIAGNOSIS — E43 Unspecified severe protein-calorie malnutrition: Secondary | ICD-10-CM | POA: Diagnosis not present

## 2021-10-18 DIAGNOSIS — R634 Abnormal weight loss: Secondary | ICD-10-CM | POA: Diagnosis not present

## 2021-10-19 DIAGNOSIS — E43 Unspecified severe protein-calorie malnutrition: Secondary | ICD-10-CM | POA: Diagnosis not present

## 2021-10-19 DIAGNOSIS — F02811 Dementia in other diseases classified elsewhere, unspecified severity, with agitation: Secondary | ICD-10-CM | POA: Diagnosis not present

## 2021-10-19 DIAGNOSIS — Z681 Body mass index (BMI) 19 or less, adult: Secondary | ICD-10-CM | POA: Diagnosis not present

## 2021-10-19 DIAGNOSIS — G301 Alzheimer's disease with late onset: Secondary | ICD-10-CM | POA: Diagnosis not present

## 2021-10-19 DIAGNOSIS — F0284 Dementia in other diseases classified elsewhere, unspecified severity, with anxiety: Secondary | ICD-10-CM | POA: Diagnosis not present

## 2021-10-19 DIAGNOSIS — R634 Abnormal weight loss: Secondary | ICD-10-CM | POA: Diagnosis not present

## 2021-10-22 DIAGNOSIS — G301 Alzheimer's disease with late onset: Secondary | ICD-10-CM | POA: Diagnosis not present

## 2021-10-22 DIAGNOSIS — R634 Abnormal weight loss: Secondary | ICD-10-CM | POA: Diagnosis not present

## 2021-10-22 DIAGNOSIS — Z681 Body mass index (BMI) 19 or less, adult: Secondary | ICD-10-CM | POA: Diagnosis not present

## 2021-10-22 DIAGNOSIS — F02811 Dementia in other diseases classified elsewhere, unspecified severity, with agitation: Secondary | ICD-10-CM | POA: Diagnosis not present

## 2021-10-22 DIAGNOSIS — F0284 Dementia in other diseases classified elsewhere, unspecified severity, with anxiety: Secondary | ICD-10-CM | POA: Diagnosis not present

## 2021-10-22 DIAGNOSIS — E43 Unspecified severe protein-calorie malnutrition: Secondary | ICD-10-CM | POA: Diagnosis not present

## 2021-10-26 DIAGNOSIS — R634 Abnormal weight loss: Secondary | ICD-10-CM | POA: Diagnosis not present

## 2021-10-26 DIAGNOSIS — F02811 Dementia in other diseases classified elsewhere, unspecified severity, with agitation: Secondary | ICD-10-CM | POA: Diagnosis not present

## 2021-10-26 DIAGNOSIS — F0284 Dementia in other diseases classified elsewhere, unspecified severity, with anxiety: Secondary | ICD-10-CM | POA: Diagnosis not present

## 2021-10-26 DIAGNOSIS — G301 Alzheimer's disease with late onset: Secondary | ICD-10-CM | POA: Diagnosis not present

## 2021-10-26 DIAGNOSIS — Z681 Body mass index (BMI) 19 or less, adult: Secondary | ICD-10-CM | POA: Diagnosis not present

## 2021-10-26 DIAGNOSIS — E43 Unspecified severe protein-calorie malnutrition: Secondary | ICD-10-CM | POA: Diagnosis not present

## 2021-10-29 DIAGNOSIS — F0284 Dementia in other diseases classified elsewhere, unspecified severity, with anxiety: Secondary | ICD-10-CM | POA: Diagnosis not present

## 2021-10-29 DIAGNOSIS — R634 Abnormal weight loss: Secondary | ICD-10-CM | POA: Diagnosis not present

## 2021-10-29 DIAGNOSIS — F02811 Dementia in other diseases classified elsewhere, unspecified severity, with agitation: Secondary | ICD-10-CM | POA: Diagnosis not present

## 2021-10-29 DIAGNOSIS — G301 Alzheimer's disease with late onset: Secondary | ICD-10-CM | POA: Diagnosis not present

## 2021-10-29 DIAGNOSIS — Z681 Body mass index (BMI) 19 or less, adult: Secondary | ICD-10-CM | POA: Diagnosis not present

## 2021-10-29 DIAGNOSIS — E43 Unspecified severe protein-calorie malnutrition: Secondary | ICD-10-CM | POA: Diagnosis not present

## 2021-11-02 DIAGNOSIS — R634 Abnormal weight loss: Secondary | ICD-10-CM | POA: Diagnosis not present

## 2021-11-02 DIAGNOSIS — E43 Unspecified severe protein-calorie malnutrition: Secondary | ICD-10-CM | POA: Diagnosis not present

## 2021-11-02 DIAGNOSIS — Z681 Body mass index (BMI) 19 or less, adult: Secondary | ICD-10-CM | POA: Diagnosis not present

## 2021-11-02 DIAGNOSIS — F0284 Dementia in other diseases classified elsewhere, unspecified severity, with anxiety: Secondary | ICD-10-CM | POA: Diagnosis not present

## 2021-11-02 DIAGNOSIS — F02811 Dementia in other diseases classified elsewhere, unspecified severity, with agitation: Secondary | ICD-10-CM | POA: Diagnosis not present

## 2021-11-02 DIAGNOSIS — G301 Alzheimer's disease with late onset: Secondary | ICD-10-CM | POA: Diagnosis not present

## 2021-11-05 DIAGNOSIS — F0284 Dementia in other diseases classified elsewhere, unspecified severity, with anxiety: Secondary | ICD-10-CM | POA: Diagnosis not present

## 2021-11-05 DIAGNOSIS — R634 Abnormal weight loss: Secondary | ICD-10-CM | POA: Diagnosis not present

## 2021-11-05 DIAGNOSIS — F02811 Dementia in other diseases classified elsewhere, unspecified severity, with agitation: Secondary | ICD-10-CM | POA: Diagnosis not present

## 2021-11-05 DIAGNOSIS — E43 Unspecified severe protein-calorie malnutrition: Secondary | ICD-10-CM | POA: Diagnosis not present

## 2021-11-05 DIAGNOSIS — Z681 Body mass index (BMI) 19 or less, adult: Secondary | ICD-10-CM | POA: Diagnosis not present

## 2021-11-05 DIAGNOSIS — G301 Alzheimer's disease with late onset: Secondary | ICD-10-CM | POA: Diagnosis not present

## 2021-11-07 DIAGNOSIS — Z681 Body mass index (BMI) 19 or less, adult: Secondary | ICD-10-CM | POA: Diagnosis not present

## 2021-11-07 DIAGNOSIS — Z853 Personal history of malignant neoplasm of breast: Secondary | ICD-10-CM | POA: Diagnosis not present

## 2021-11-07 DIAGNOSIS — G301 Alzheimer's disease with late onset: Secondary | ICD-10-CM | POA: Diagnosis not present

## 2021-11-07 DIAGNOSIS — L89320 Pressure ulcer of left buttock, unstageable: Secondary | ICD-10-CM | POA: Diagnosis not present

## 2021-11-07 DIAGNOSIS — F0284 Dementia in other diseases classified elsewhere, unspecified severity, with anxiety: Secondary | ICD-10-CM | POA: Diagnosis not present

## 2021-11-07 DIAGNOSIS — R410 Disorientation, unspecified: Secondary | ICD-10-CM | POA: Diagnosis not present

## 2021-11-07 DIAGNOSIS — F02811 Dementia in other diseases classified elsewhere, unspecified severity, with agitation: Secondary | ICD-10-CM | POA: Diagnosis not present

## 2021-11-07 DIAGNOSIS — E43 Unspecified severe protein-calorie malnutrition: Secondary | ICD-10-CM | POA: Diagnosis not present

## 2021-11-07 DIAGNOSIS — L89150 Pressure ulcer of sacral region, unstageable: Secondary | ICD-10-CM | POA: Diagnosis not present

## 2021-11-07 DIAGNOSIS — F32A Depression, unspecified: Secondary | ICD-10-CM | POA: Diagnosis not present

## 2021-11-07 DIAGNOSIS — R634 Abnormal weight loss: Secondary | ICD-10-CM | POA: Diagnosis not present

## 2021-11-07 DIAGNOSIS — Z8679 Personal history of other diseases of the circulatory system: Secondary | ICD-10-CM | POA: Diagnosis not present

## 2021-11-07 DIAGNOSIS — Z8616 Personal history of COVID-19: Secondary | ICD-10-CM | POA: Diagnosis not present

## 2021-11-09 DIAGNOSIS — E43 Unspecified severe protein-calorie malnutrition: Secondary | ICD-10-CM | POA: Diagnosis not present

## 2021-11-09 DIAGNOSIS — G301 Alzheimer's disease with late onset: Secondary | ICD-10-CM | POA: Diagnosis not present

## 2021-11-09 DIAGNOSIS — F0284 Dementia in other diseases classified elsewhere, unspecified severity, with anxiety: Secondary | ICD-10-CM | POA: Diagnosis not present

## 2021-11-09 DIAGNOSIS — F02811 Dementia in other diseases classified elsewhere, unspecified severity, with agitation: Secondary | ICD-10-CM | POA: Diagnosis not present

## 2021-11-09 DIAGNOSIS — Z681 Body mass index (BMI) 19 or less, adult: Secondary | ICD-10-CM | POA: Diagnosis not present

## 2021-11-09 DIAGNOSIS — R634 Abnormal weight loss: Secondary | ICD-10-CM | POA: Diagnosis not present

## 2021-11-11 DIAGNOSIS — F0284 Dementia in other diseases classified elsewhere, unspecified severity, with anxiety: Secondary | ICD-10-CM | POA: Diagnosis not present

## 2021-11-11 DIAGNOSIS — Z681 Body mass index (BMI) 19 or less, adult: Secondary | ICD-10-CM | POA: Diagnosis not present

## 2021-11-11 DIAGNOSIS — F02811 Dementia in other diseases classified elsewhere, unspecified severity, with agitation: Secondary | ICD-10-CM | POA: Diagnosis not present

## 2021-11-11 DIAGNOSIS — E43 Unspecified severe protein-calorie malnutrition: Secondary | ICD-10-CM | POA: Diagnosis not present

## 2021-11-11 DIAGNOSIS — G301 Alzheimer's disease with late onset: Secondary | ICD-10-CM | POA: Diagnosis not present

## 2021-11-11 DIAGNOSIS — R634 Abnormal weight loss: Secondary | ICD-10-CM | POA: Diagnosis not present

## 2021-11-12 DIAGNOSIS — Z681 Body mass index (BMI) 19 or less, adult: Secondary | ICD-10-CM | POA: Diagnosis not present

## 2021-11-12 DIAGNOSIS — F0284 Dementia in other diseases classified elsewhere, unspecified severity, with anxiety: Secondary | ICD-10-CM | POA: Diagnosis not present

## 2021-11-12 DIAGNOSIS — E43 Unspecified severe protein-calorie malnutrition: Secondary | ICD-10-CM | POA: Diagnosis not present

## 2021-11-12 DIAGNOSIS — F02811 Dementia in other diseases classified elsewhere, unspecified severity, with agitation: Secondary | ICD-10-CM | POA: Diagnosis not present

## 2021-11-12 DIAGNOSIS — R634 Abnormal weight loss: Secondary | ICD-10-CM | POA: Diagnosis not present

## 2021-11-12 DIAGNOSIS — G301 Alzheimer's disease with late onset: Secondary | ICD-10-CM | POA: Diagnosis not present

## 2021-11-13 DIAGNOSIS — F02811 Dementia in other diseases classified elsewhere, unspecified severity, with agitation: Secondary | ICD-10-CM | POA: Diagnosis not present

## 2021-11-13 DIAGNOSIS — Z681 Body mass index (BMI) 19 or less, adult: Secondary | ICD-10-CM | POA: Diagnosis not present

## 2021-11-13 DIAGNOSIS — E43 Unspecified severe protein-calorie malnutrition: Secondary | ICD-10-CM | POA: Diagnosis not present

## 2021-11-13 DIAGNOSIS — R634 Abnormal weight loss: Secondary | ICD-10-CM | POA: Diagnosis not present

## 2021-11-13 DIAGNOSIS — F0284 Dementia in other diseases classified elsewhere, unspecified severity, with anxiety: Secondary | ICD-10-CM | POA: Diagnosis not present

## 2021-11-13 DIAGNOSIS — G301 Alzheimer's disease with late onset: Secondary | ICD-10-CM | POA: Diagnosis not present

## 2021-11-14 DIAGNOSIS — F02811 Dementia in other diseases classified elsewhere, unspecified severity, with agitation: Secondary | ICD-10-CM | POA: Diagnosis not present

## 2021-11-14 DIAGNOSIS — F0284 Dementia in other diseases classified elsewhere, unspecified severity, with anxiety: Secondary | ICD-10-CM | POA: Diagnosis not present

## 2021-11-14 DIAGNOSIS — G301 Alzheimer's disease with late onset: Secondary | ICD-10-CM | POA: Diagnosis not present

## 2021-11-14 DIAGNOSIS — Z681 Body mass index (BMI) 19 or less, adult: Secondary | ICD-10-CM | POA: Diagnosis not present

## 2021-11-14 DIAGNOSIS — E43 Unspecified severe protein-calorie malnutrition: Secondary | ICD-10-CM | POA: Diagnosis not present

## 2021-11-14 DIAGNOSIS — R634 Abnormal weight loss: Secondary | ICD-10-CM | POA: Diagnosis not present

## 2021-11-16 DIAGNOSIS — E43 Unspecified severe protein-calorie malnutrition: Secondary | ICD-10-CM | POA: Diagnosis not present

## 2021-11-16 DIAGNOSIS — R634 Abnormal weight loss: Secondary | ICD-10-CM | POA: Diagnosis not present

## 2021-11-16 DIAGNOSIS — G301 Alzheimer's disease with late onset: Secondary | ICD-10-CM | POA: Diagnosis not present

## 2021-11-16 DIAGNOSIS — F02811 Dementia in other diseases classified elsewhere, unspecified severity, with agitation: Secondary | ICD-10-CM | POA: Diagnosis not present

## 2021-11-16 DIAGNOSIS — Z681 Body mass index (BMI) 19 or less, adult: Secondary | ICD-10-CM | POA: Diagnosis not present

## 2021-11-16 DIAGNOSIS — F0284 Dementia in other diseases classified elsewhere, unspecified severity, with anxiety: Secondary | ICD-10-CM | POA: Diagnosis not present

## 2021-11-17 DIAGNOSIS — F0284 Dementia in other diseases classified elsewhere, unspecified severity, with anxiety: Secondary | ICD-10-CM | POA: Diagnosis not present

## 2021-11-17 DIAGNOSIS — R634 Abnormal weight loss: Secondary | ICD-10-CM | POA: Diagnosis not present

## 2021-11-17 DIAGNOSIS — E43 Unspecified severe protein-calorie malnutrition: Secondary | ICD-10-CM | POA: Diagnosis not present

## 2021-11-17 DIAGNOSIS — G301 Alzheimer's disease with late onset: Secondary | ICD-10-CM | POA: Diagnosis not present

## 2021-11-17 DIAGNOSIS — F02811 Dementia in other diseases classified elsewhere, unspecified severity, with agitation: Secondary | ICD-10-CM | POA: Diagnosis not present

## 2021-11-17 DIAGNOSIS — Z681 Body mass index (BMI) 19 or less, adult: Secondary | ICD-10-CM | POA: Diagnosis not present

## 2021-11-19 DIAGNOSIS — F02811 Dementia in other diseases classified elsewhere, unspecified severity, with agitation: Secondary | ICD-10-CM | POA: Diagnosis not present

## 2021-11-19 DIAGNOSIS — E43 Unspecified severe protein-calorie malnutrition: Secondary | ICD-10-CM | POA: Diagnosis not present

## 2021-11-19 DIAGNOSIS — G301 Alzheimer's disease with late onset: Secondary | ICD-10-CM | POA: Diagnosis not present

## 2021-11-19 DIAGNOSIS — R634 Abnormal weight loss: Secondary | ICD-10-CM | POA: Diagnosis not present

## 2021-11-19 DIAGNOSIS — Z681 Body mass index (BMI) 19 or less, adult: Secondary | ICD-10-CM | POA: Diagnosis not present

## 2021-11-19 DIAGNOSIS — F0284 Dementia in other diseases classified elsewhere, unspecified severity, with anxiety: Secondary | ICD-10-CM | POA: Diagnosis not present

## 2021-11-22 DIAGNOSIS — G301 Alzheimer's disease with late onset: Secondary | ICD-10-CM | POA: Diagnosis not present

## 2021-11-22 DIAGNOSIS — R634 Abnormal weight loss: Secondary | ICD-10-CM | POA: Diagnosis not present

## 2021-11-22 DIAGNOSIS — Z681 Body mass index (BMI) 19 or less, adult: Secondary | ICD-10-CM | POA: Diagnosis not present

## 2021-11-22 DIAGNOSIS — F02811 Dementia in other diseases classified elsewhere, unspecified severity, with agitation: Secondary | ICD-10-CM | POA: Diagnosis not present

## 2021-11-22 DIAGNOSIS — F0284 Dementia in other diseases classified elsewhere, unspecified severity, with anxiety: Secondary | ICD-10-CM | POA: Diagnosis not present

## 2021-11-22 DIAGNOSIS — E43 Unspecified severe protein-calorie malnutrition: Secondary | ICD-10-CM | POA: Diagnosis not present

## 2021-11-23 DIAGNOSIS — Z681 Body mass index (BMI) 19 or less, adult: Secondary | ICD-10-CM | POA: Diagnosis not present

## 2021-11-23 DIAGNOSIS — F02811 Dementia in other diseases classified elsewhere, unspecified severity, with agitation: Secondary | ICD-10-CM | POA: Diagnosis not present

## 2021-11-23 DIAGNOSIS — R634 Abnormal weight loss: Secondary | ICD-10-CM | POA: Diagnosis not present

## 2021-11-23 DIAGNOSIS — F0284 Dementia in other diseases classified elsewhere, unspecified severity, with anxiety: Secondary | ICD-10-CM | POA: Diagnosis not present

## 2021-11-23 DIAGNOSIS — E43 Unspecified severe protein-calorie malnutrition: Secondary | ICD-10-CM | POA: Diagnosis not present

## 2021-11-23 DIAGNOSIS — G301 Alzheimer's disease with late onset: Secondary | ICD-10-CM | POA: Diagnosis not present

## 2021-11-26 DIAGNOSIS — E43 Unspecified severe protein-calorie malnutrition: Secondary | ICD-10-CM | POA: Diagnosis not present

## 2021-11-26 DIAGNOSIS — R634 Abnormal weight loss: Secondary | ICD-10-CM | POA: Diagnosis not present

## 2021-11-26 DIAGNOSIS — F02811 Dementia in other diseases classified elsewhere, unspecified severity, with agitation: Secondary | ICD-10-CM | POA: Diagnosis not present

## 2021-11-26 DIAGNOSIS — F0284 Dementia in other diseases classified elsewhere, unspecified severity, with anxiety: Secondary | ICD-10-CM | POA: Diagnosis not present

## 2021-11-26 DIAGNOSIS — G301 Alzheimer's disease with late onset: Secondary | ICD-10-CM | POA: Diagnosis not present

## 2021-11-26 DIAGNOSIS — Z681 Body mass index (BMI) 19 or less, adult: Secondary | ICD-10-CM | POA: Diagnosis not present

## 2021-11-30 DIAGNOSIS — G301 Alzheimer's disease with late onset: Secondary | ICD-10-CM | POA: Diagnosis not present

## 2021-11-30 DIAGNOSIS — F0284 Dementia in other diseases classified elsewhere, unspecified severity, with anxiety: Secondary | ICD-10-CM | POA: Diagnosis not present

## 2021-11-30 DIAGNOSIS — R634 Abnormal weight loss: Secondary | ICD-10-CM | POA: Diagnosis not present

## 2021-11-30 DIAGNOSIS — E43 Unspecified severe protein-calorie malnutrition: Secondary | ICD-10-CM | POA: Diagnosis not present

## 2021-11-30 DIAGNOSIS — F02811 Dementia in other diseases classified elsewhere, unspecified severity, with agitation: Secondary | ICD-10-CM | POA: Diagnosis not present

## 2021-11-30 DIAGNOSIS — Z681 Body mass index (BMI) 19 or less, adult: Secondary | ICD-10-CM | POA: Diagnosis not present

## 2021-12-03 DIAGNOSIS — R634 Abnormal weight loss: Secondary | ICD-10-CM | POA: Diagnosis not present

## 2021-12-03 DIAGNOSIS — G301 Alzheimer's disease with late onset: Secondary | ICD-10-CM | POA: Diagnosis not present

## 2021-12-03 DIAGNOSIS — F0284 Dementia in other diseases classified elsewhere, unspecified severity, with anxiety: Secondary | ICD-10-CM | POA: Diagnosis not present

## 2021-12-03 DIAGNOSIS — E43 Unspecified severe protein-calorie malnutrition: Secondary | ICD-10-CM | POA: Diagnosis not present

## 2021-12-03 DIAGNOSIS — F02811 Dementia in other diseases classified elsewhere, unspecified severity, with agitation: Secondary | ICD-10-CM | POA: Diagnosis not present

## 2021-12-03 DIAGNOSIS — Z681 Body mass index (BMI) 19 or less, adult: Secondary | ICD-10-CM | POA: Diagnosis not present

## 2021-12-07 DIAGNOSIS — R634 Abnormal weight loss: Secondary | ICD-10-CM | POA: Diagnosis not present

## 2021-12-07 DIAGNOSIS — F32A Depression, unspecified: Secondary | ICD-10-CM | POA: Diagnosis not present

## 2021-12-07 DIAGNOSIS — L89320 Pressure ulcer of left buttock, unstageable: Secondary | ICD-10-CM | POA: Diagnosis not present

## 2021-12-07 DIAGNOSIS — Z8679 Personal history of other diseases of the circulatory system: Secondary | ICD-10-CM | POA: Diagnosis not present

## 2021-12-07 DIAGNOSIS — Z8616 Personal history of COVID-19: Secondary | ICD-10-CM | POA: Diagnosis not present

## 2021-12-07 DIAGNOSIS — G301 Alzheimer's disease with late onset: Secondary | ICD-10-CM | POA: Diagnosis not present

## 2021-12-07 DIAGNOSIS — F0284 Dementia in other diseases classified elsewhere, unspecified severity, with anxiety: Secondary | ICD-10-CM | POA: Diagnosis not present

## 2021-12-07 DIAGNOSIS — F02811 Dementia in other diseases classified elsewhere, unspecified severity, with agitation: Secondary | ICD-10-CM | POA: Diagnosis not present

## 2021-12-07 DIAGNOSIS — Z853 Personal history of malignant neoplasm of breast: Secondary | ICD-10-CM | POA: Diagnosis not present

## 2021-12-07 DIAGNOSIS — R410 Disorientation, unspecified: Secondary | ICD-10-CM | POA: Diagnosis not present

## 2021-12-07 DIAGNOSIS — L89150 Pressure ulcer of sacral region, unstageable: Secondary | ICD-10-CM | POA: Diagnosis not present

## 2021-12-07 DIAGNOSIS — E43 Unspecified severe protein-calorie malnutrition: Secondary | ICD-10-CM | POA: Diagnosis not present

## 2021-12-07 DIAGNOSIS — Z681 Body mass index (BMI) 19 or less, adult: Secondary | ICD-10-CM | POA: Diagnosis not present

## 2021-12-10 DIAGNOSIS — R634 Abnormal weight loss: Secondary | ICD-10-CM | POA: Diagnosis not present

## 2021-12-10 DIAGNOSIS — Z681 Body mass index (BMI) 19 or less, adult: Secondary | ICD-10-CM | POA: Diagnosis not present

## 2021-12-10 DIAGNOSIS — G301 Alzheimer's disease with late onset: Secondary | ICD-10-CM | POA: Diagnosis not present

## 2021-12-10 DIAGNOSIS — F0284 Dementia in other diseases classified elsewhere, unspecified severity, with anxiety: Secondary | ICD-10-CM | POA: Diagnosis not present

## 2021-12-10 DIAGNOSIS — E43 Unspecified severe protein-calorie malnutrition: Secondary | ICD-10-CM | POA: Diagnosis not present

## 2021-12-10 DIAGNOSIS — F02811 Dementia in other diseases classified elsewhere, unspecified severity, with agitation: Secondary | ICD-10-CM | POA: Diagnosis not present

## 2021-12-14 DIAGNOSIS — Z681 Body mass index (BMI) 19 or less, adult: Secondary | ICD-10-CM | POA: Diagnosis not present

## 2021-12-14 DIAGNOSIS — G301 Alzheimer's disease with late onset: Secondary | ICD-10-CM | POA: Diagnosis not present

## 2021-12-14 DIAGNOSIS — F0284 Dementia in other diseases classified elsewhere, unspecified severity, with anxiety: Secondary | ICD-10-CM | POA: Diagnosis not present

## 2021-12-14 DIAGNOSIS — F02811 Dementia in other diseases classified elsewhere, unspecified severity, with agitation: Secondary | ICD-10-CM | POA: Diagnosis not present

## 2021-12-14 DIAGNOSIS — E43 Unspecified severe protein-calorie malnutrition: Secondary | ICD-10-CM | POA: Diagnosis not present

## 2021-12-14 DIAGNOSIS — R634 Abnormal weight loss: Secondary | ICD-10-CM | POA: Diagnosis not present

## 2021-12-17 DIAGNOSIS — Z681 Body mass index (BMI) 19 or less, adult: Secondary | ICD-10-CM | POA: Diagnosis not present

## 2021-12-17 DIAGNOSIS — E43 Unspecified severe protein-calorie malnutrition: Secondary | ICD-10-CM | POA: Diagnosis not present

## 2021-12-17 DIAGNOSIS — R634 Abnormal weight loss: Secondary | ICD-10-CM | POA: Diagnosis not present

## 2021-12-17 DIAGNOSIS — F0284 Dementia in other diseases classified elsewhere, unspecified severity, with anxiety: Secondary | ICD-10-CM | POA: Diagnosis not present

## 2021-12-17 DIAGNOSIS — G301 Alzheimer's disease with late onset: Secondary | ICD-10-CM | POA: Diagnosis not present

## 2021-12-17 DIAGNOSIS — F02811 Dementia in other diseases classified elsewhere, unspecified severity, with agitation: Secondary | ICD-10-CM | POA: Diagnosis not present

## 2021-12-21 DIAGNOSIS — Z681 Body mass index (BMI) 19 or less, adult: Secondary | ICD-10-CM | POA: Diagnosis not present

## 2021-12-21 DIAGNOSIS — G301 Alzheimer's disease with late onset: Secondary | ICD-10-CM | POA: Diagnosis not present

## 2021-12-21 DIAGNOSIS — E43 Unspecified severe protein-calorie malnutrition: Secondary | ICD-10-CM | POA: Diagnosis not present

## 2021-12-21 DIAGNOSIS — F02811 Dementia in other diseases classified elsewhere, unspecified severity, with agitation: Secondary | ICD-10-CM | POA: Diagnosis not present

## 2021-12-21 DIAGNOSIS — F0284 Dementia in other diseases classified elsewhere, unspecified severity, with anxiety: Secondary | ICD-10-CM | POA: Diagnosis not present

## 2021-12-21 DIAGNOSIS — R634 Abnormal weight loss: Secondary | ICD-10-CM | POA: Diagnosis not present

## 2021-12-24 DIAGNOSIS — F0284 Dementia in other diseases classified elsewhere, unspecified severity, with anxiety: Secondary | ICD-10-CM | POA: Diagnosis not present

## 2021-12-24 DIAGNOSIS — G301 Alzheimer's disease with late onset: Secondary | ICD-10-CM | POA: Diagnosis not present

## 2021-12-24 DIAGNOSIS — Z681 Body mass index (BMI) 19 or less, adult: Secondary | ICD-10-CM | POA: Diagnosis not present

## 2021-12-24 DIAGNOSIS — E43 Unspecified severe protein-calorie malnutrition: Secondary | ICD-10-CM | POA: Diagnosis not present

## 2021-12-24 DIAGNOSIS — R634 Abnormal weight loss: Secondary | ICD-10-CM | POA: Diagnosis not present

## 2021-12-24 DIAGNOSIS — F02811 Dementia in other diseases classified elsewhere, unspecified severity, with agitation: Secondary | ICD-10-CM | POA: Diagnosis not present

## 2021-12-28 DIAGNOSIS — G301 Alzheimer's disease with late onset: Secondary | ICD-10-CM | POA: Diagnosis not present

## 2021-12-28 DIAGNOSIS — F0284 Dementia in other diseases classified elsewhere, unspecified severity, with anxiety: Secondary | ICD-10-CM | POA: Diagnosis not present

## 2021-12-28 DIAGNOSIS — Z681 Body mass index (BMI) 19 or less, adult: Secondary | ICD-10-CM | POA: Diagnosis not present

## 2021-12-28 DIAGNOSIS — F02811 Dementia in other diseases classified elsewhere, unspecified severity, with agitation: Secondary | ICD-10-CM | POA: Diagnosis not present

## 2021-12-28 DIAGNOSIS — E43 Unspecified severe protein-calorie malnutrition: Secondary | ICD-10-CM | POA: Diagnosis not present

## 2021-12-28 DIAGNOSIS — R634 Abnormal weight loss: Secondary | ICD-10-CM | POA: Diagnosis not present

## 2021-12-31 DIAGNOSIS — F0284 Dementia in other diseases classified elsewhere, unspecified severity, with anxiety: Secondary | ICD-10-CM | POA: Diagnosis not present

## 2021-12-31 DIAGNOSIS — E43 Unspecified severe protein-calorie malnutrition: Secondary | ICD-10-CM | POA: Diagnosis not present

## 2021-12-31 DIAGNOSIS — G301 Alzheimer's disease with late onset: Secondary | ICD-10-CM | POA: Diagnosis not present

## 2021-12-31 DIAGNOSIS — Z681 Body mass index (BMI) 19 or less, adult: Secondary | ICD-10-CM | POA: Diagnosis not present

## 2021-12-31 DIAGNOSIS — F02811 Dementia in other diseases classified elsewhere, unspecified severity, with agitation: Secondary | ICD-10-CM | POA: Diagnosis not present

## 2021-12-31 DIAGNOSIS — R634 Abnormal weight loss: Secondary | ICD-10-CM | POA: Diagnosis not present

## 2022-01-04 DIAGNOSIS — G301 Alzheimer's disease with late onset: Secondary | ICD-10-CM | POA: Diagnosis not present

## 2022-01-04 DIAGNOSIS — Z681 Body mass index (BMI) 19 or less, adult: Secondary | ICD-10-CM | POA: Diagnosis not present

## 2022-01-04 DIAGNOSIS — R634 Abnormal weight loss: Secondary | ICD-10-CM | POA: Diagnosis not present

## 2022-01-04 DIAGNOSIS — E43 Unspecified severe protein-calorie malnutrition: Secondary | ICD-10-CM | POA: Diagnosis not present

## 2022-01-04 DIAGNOSIS — F02811 Dementia in other diseases classified elsewhere, unspecified severity, with agitation: Secondary | ICD-10-CM | POA: Diagnosis not present

## 2022-01-04 DIAGNOSIS — F0284 Dementia in other diseases classified elsewhere, unspecified severity, with anxiety: Secondary | ICD-10-CM | POA: Diagnosis not present

## 2022-01-07 DIAGNOSIS — Z853 Personal history of malignant neoplasm of breast: Secondary | ICD-10-CM | POA: Diagnosis not present

## 2022-01-07 DIAGNOSIS — L89320 Pressure ulcer of left buttock, unstageable: Secondary | ICD-10-CM | POA: Diagnosis not present

## 2022-01-07 DIAGNOSIS — Z8679 Personal history of other diseases of the circulatory system: Secondary | ICD-10-CM | POA: Diagnosis not present

## 2022-01-07 DIAGNOSIS — L89150 Pressure ulcer of sacral region, unstageable: Secondary | ICD-10-CM | POA: Diagnosis not present

## 2022-01-07 DIAGNOSIS — F32A Depression, unspecified: Secondary | ICD-10-CM | POA: Diagnosis not present

## 2022-01-07 DIAGNOSIS — E43 Unspecified severe protein-calorie malnutrition: Secondary | ICD-10-CM | POA: Diagnosis not present

## 2022-01-07 DIAGNOSIS — F0284 Dementia in other diseases classified elsewhere, unspecified severity, with anxiety: Secondary | ICD-10-CM | POA: Diagnosis not present

## 2022-01-07 DIAGNOSIS — L89616 Pressure-induced deep tissue damage of right heel: Secondary | ICD-10-CM | POA: Diagnosis not present

## 2022-01-07 DIAGNOSIS — L89310 Pressure ulcer of right buttock, unstageable: Secondary | ICD-10-CM | POA: Diagnosis not present

## 2022-01-07 DIAGNOSIS — R634 Abnormal weight loss: Secondary | ICD-10-CM | POA: Diagnosis not present

## 2022-01-07 DIAGNOSIS — F02811 Dementia in other diseases classified elsewhere, unspecified severity, with agitation: Secondary | ICD-10-CM | POA: Diagnosis not present

## 2022-01-07 DIAGNOSIS — R410 Disorientation, unspecified: Secondary | ICD-10-CM | POA: Diagnosis not present

## 2022-01-07 DIAGNOSIS — Z8616 Personal history of COVID-19: Secondary | ICD-10-CM | POA: Diagnosis not present

## 2022-01-07 DIAGNOSIS — Z681 Body mass index (BMI) 19 or less, adult: Secondary | ICD-10-CM | POA: Diagnosis not present

## 2022-01-07 DIAGNOSIS — G301 Alzheimer's disease with late onset: Secondary | ICD-10-CM | POA: Diagnosis not present

## 2022-01-11 DIAGNOSIS — E43 Unspecified severe protein-calorie malnutrition: Secondary | ICD-10-CM | POA: Diagnosis not present

## 2022-01-11 DIAGNOSIS — G301 Alzheimer's disease with late onset: Secondary | ICD-10-CM | POA: Diagnosis not present

## 2022-01-11 DIAGNOSIS — R634 Abnormal weight loss: Secondary | ICD-10-CM | POA: Diagnosis not present

## 2022-01-11 DIAGNOSIS — F0284 Dementia in other diseases classified elsewhere, unspecified severity, with anxiety: Secondary | ICD-10-CM | POA: Diagnosis not present

## 2022-01-11 DIAGNOSIS — Z681 Body mass index (BMI) 19 or less, adult: Secondary | ICD-10-CM | POA: Diagnosis not present

## 2022-01-11 DIAGNOSIS — F02811 Dementia in other diseases classified elsewhere, unspecified severity, with agitation: Secondary | ICD-10-CM | POA: Diagnosis not present

## 2022-01-14 DIAGNOSIS — E43 Unspecified severe protein-calorie malnutrition: Secondary | ICD-10-CM | POA: Diagnosis not present

## 2022-01-14 DIAGNOSIS — Z681 Body mass index (BMI) 19 or less, adult: Secondary | ICD-10-CM | POA: Diagnosis not present

## 2022-01-14 DIAGNOSIS — F02811 Dementia in other diseases classified elsewhere, unspecified severity, with agitation: Secondary | ICD-10-CM | POA: Diagnosis not present

## 2022-01-14 DIAGNOSIS — G301 Alzheimer's disease with late onset: Secondary | ICD-10-CM | POA: Diagnosis not present

## 2022-01-14 DIAGNOSIS — R634 Abnormal weight loss: Secondary | ICD-10-CM | POA: Diagnosis not present

## 2022-01-14 DIAGNOSIS — F0284 Dementia in other diseases classified elsewhere, unspecified severity, with anxiety: Secondary | ICD-10-CM | POA: Diagnosis not present

## 2022-01-18 DIAGNOSIS — F0284 Dementia in other diseases classified elsewhere, unspecified severity, with anxiety: Secondary | ICD-10-CM | POA: Diagnosis not present

## 2022-01-18 DIAGNOSIS — F02811 Dementia in other diseases classified elsewhere, unspecified severity, with agitation: Secondary | ICD-10-CM | POA: Diagnosis not present

## 2022-01-18 DIAGNOSIS — R634 Abnormal weight loss: Secondary | ICD-10-CM | POA: Diagnosis not present

## 2022-01-18 DIAGNOSIS — G301 Alzheimer's disease with late onset: Secondary | ICD-10-CM | POA: Diagnosis not present

## 2022-01-18 DIAGNOSIS — E43 Unspecified severe protein-calorie malnutrition: Secondary | ICD-10-CM | POA: Diagnosis not present

## 2022-01-18 DIAGNOSIS — Z681 Body mass index (BMI) 19 or less, adult: Secondary | ICD-10-CM | POA: Diagnosis not present

## 2022-01-19 DIAGNOSIS — R634 Abnormal weight loss: Secondary | ICD-10-CM | POA: Diagnosis not present

## 2022-01-19 DIAGNOSIS — F0284 Dementia in other diseases classified elsewhere, unspecified severity, with anxiety: Secondary | ICD-10-CM | POA: Diagnosis not present

## 2022-01-19 DIAGNOSIS — F02811 Dementia in other diseases classified elsewhere, unspecified severity, with agitation: Secondary | ICD-10-CM | POA: Diagnosis not present

## 2022-01-19 DIAGNOSIS — Z681 Body mass index (BMI) 19 or less, adult: Secondary | ICD-10-CM | POA: Diagnosis not present

## 2022-01-19 DIAGNOSIS — E43 Unspecified severe protein-calorie malnutrition: Secondary | ICD-10-CM | POA: Diagnosis not present

## 2022-01-19 DIAGNOSIS — G301 Alzheimer's disease with late onset: Secondary | ICD-10-CM | POA: Diagnosis not present

## 2022-01-21 DIAGNOSIS — F02811 Dementia in other diseases classified elsewhere, unspecified severity, with agitation: Secondary | ICD-10-CM | POA: Diagnosis not present

## 2022-01-21 DIAGNOSIS — E43 Unspecified severe protein-calorie malnutrition: Secondary | ICD-10-CM | POA: Diagnosis not present

## 2022-01-21 DIAGNOSIS — R634 Abnormal weight loss: Secondary | ICD-10-CM | POA: Diagnosis not present

## 2022-01-21 DIAGNOSIS — F0284 Dementia in other diseases classified elsewhere, unspecified severity, with anxiety: Secondary | ICD-10-CM | POA: Diagnosis not present

## 2022-01-21 DIAGNOSIS — G301 Alzheimer's disease with late onset: Secondary | ICD-10-CM | POA: Diagnosis not present

## 2022-01-21 DIAGNOSIS — Z681 Body mass index (BMI) 19 or less, adult: Secondary | ICD-10-CM | POA: Diagnosis not present

## 2022-01-25 DIAGNOSIS — F02811 Dementia in other diseases classified elsewhere, unspecified severity, with agitation: Secondary | ICD-10-CM | POA: Diagnosis not present

## 2022-01-25 DIAGNOSIS — E43 Unspecified severe protein-calorie malnutrition: Secondary | ICD-10-CM | POA: Diagnosis not present

## 2022-01-25 DIAGNOSIS — Z681 Body mass index (BMI) 19 or less, adult: Secondary | ICD-10-CM | POA: Diagnosis not present

## 2022-01-25 DIAGNOSIS — R634 Abnormal weight loss: Secondary | ICD-10-CM | POA: Diagnosis not present

## 2022-01-25 DIAGNOSIS — G301 Alzheimer's disease with late onset: Secondary | ICD-10-CM | POA: Diagnosis not present

## 2022-01-25 DIAGNOSIS — F0284 Dementia in other diseases classified elsewhere, unspecified severity, with anxiety: Secondary | ICD-10-CM | POA: Diagnosis not present

## 2022-01-28 DIAGNOSIS — F0284 Dementia in other diseases classified elsewhere, unspecified severity, with anxiety: Secondary | ICD-10-CM | POA: Diagnosis not present

## 2022-01-28 DIAGNOSIS — F02811 Dementia in other diseases classified elsewhere, unspecified severity, with agitation: Secondary | ICD-10-CM | POA: Diagnosis not present

## 2022-01-28 DIAGNOSIS — Z681 Body mass index (BMI) 19 or less, adult: Secondary | ICD-10-CM | POA: Diagnosis not present

## 2022-01-28 DIAGNOSIS — E43 Unspecified severe protein-calorie malnutrition: Secondary | ICD-10-CM | POA: Diagnosis not present

## 2022-01-28 DIAGNOSIS — R634 Abnormal weight loss: Secondary | ICD-10-CM | POA: Diagnosis not present

## 2022-01-28 DIAGNOSIS — G301 Alzheimer's disease with late onset: Secondary | ICD-10-CM | POA: Diagnosis not present

## 2022-01-29 DIAGNOSIS — Z681 Body mass index (BMI) 19 or less, adult: Secondary | ICD-10-CM | POA: Diagnosis not present

## 2022-01-29 DIAGNOSIS — E43 Unspecified severe protein-calorie malnutrition: Secondary | ICD-10-CM | POA: Diagnosis not present

## 2022-01-29 DIAGNOSIS — R634 Abnormal weight loss: Secondary | ICD-10-CM | POA: Diagnosis not present

## 2022-01-29 DIAGNOSIS — F02811 Dementia in other diseases classified elsewhere, unspecified severity, with agitation: Secondary | ICD-10-CM | POA: Diagnosis not present

## 2022-01-29 DIAGNOSIS — F0284 Dementia in other diseases classified elsewhere, unspecified severity, with anxiety: Secondary | ICD-10-CM | POA: Diagnosis not present

## 2022-01-29 DIAGNOSIS — G301 Alzheimer's disease with late onset: Secondary | ICD-10-CM | POA: Diagnosis not present

## 2022-01-31 DIAGNOSIS — F0284 Dementia in other diseases classified elsewhere, unspecified severity, with anxiety: Secondary | ICD-10-CM | POA: Diagnosis not present

## 2022-01-31 DIAGNOSIS — G301 Alzheimer's disease with late onset: Secondary | ICD-10-CM | POA: Diagnosis not present

## 2022-01-31 DIAGNOSIS — R634 Abnormal weight loss: Secondary | ICD-10-CM | POA: Diagnosis not present

## 2022-01-31 DIAGNOSIS — F02811 Dementia in other diseases classified elsewhere, unspecified severity, with agitation: Secondary | ICD-10-CM | POA: Diagnosis not present

## 2022-01-31 DIAGNOSIS — E43 Unspecified severe protein-calorie malnutrition: Secondary | ICD-10-CM | POA: Diagnosis not present

## 2022-01-31 DIAGNOSIS — Z681 Body mass index (BMI) 19 or less, adult: Secondary | ICD-10-CM | POA: Diagnosis not present

## 2022-02-01 DIAGNOSIS — G301 Alzheimer's disease with late onset: Secondary | ICD-10-CM | POA: Diagnosis not present

## 2022-02-01 DIAGNOSIS — R634 Abnormal weight loss: Secondary | ICD-10-CM | POA: Diagnosis not present

## 2022-02-01 DIAGNOSIS — F0284 Dementia in other diseases classified elsewhere, unspecified severity, with anxiety: Secondary | ICD-10-CM | POA: Diagnosis not present

## 2022-02-01 DIAGNOSIS — F02811 Dementia in other diseases classified elsewhere, unspecified severity, with agitation: Secondary | ICD-10-CM | POA: Diagnosis not present

## 2022-02-01 DIAGNOSIS — E43 Unspecified severe protein-calorie malnutrition: Secondary | ICD-10-CM | POA: Diagnosis not present

## 2022-02-01 DIAGNOSIS — Z681 Body mass index (BMI) 19 or less, adult: Secondary | ICD-10-CM | POA: Diagnosis not present

## 2022-02-04 DIAGNOSIS — R634 Abnormal weight loss: Secondary | ICD-10-CM | POA: Diagnosis not present

## 2022-02-04 DIAGNOSIS — E43 Unspecified severe protein-calorie malnutrition: Secondary | ICD-10-CM | POA: Diagnosis not present

## 2022-02-04 DIAGNOSIS — F02811 Dementia in other diseases classified elsewhere, unspecified severity, with agitation: Secondary | ICD-10-CM | POA: Diagnosis not present

## 2022-02-04 DIAGNOSIS — G301 Alzheimer's disease with late onset: Secondary | ICD-10-CM | POA: Diagnosis not present

## 2022-02-04 DIAGNOSIS — Z681 Body mass index (BMI) 19 or less, adult: Secondary | ICD-10-CM | POA: Diagnosis not present

## 2022-02-04 DIAGNOSIS — F0284 Dementia in other diseases classified elsewhere, unspecified severity, with anxiety: Secondary | ICD-10-CM | POA: Diagnosis not present

## 2022-02-06 DIAGNOSIS — F0284 Dementia in other diseases classified elsewhere, unspecified severity, with anxiety: Secondary | ICD-10-CM | POA: Diagnosis not present

## 2022-02-06 DIAGNOSIS — Z8679 Personal history of other diseases of the circulatory system: Secondary | ICD-10-CM | POA: Diagnosis not present

## 2022-02-06 DIAGNOSIS — L89616 Pressure-induced deep tissue damage of right heel: Secondary | ICD-10-CM | POA: Diagnosis not present

## 2022-02-06 DIAGNOSIS — L89310 Pressure ulcer of right buttock, unstageable: Secondary | ICD-10-CM | POA: Diagnosis not present

## 2022-02-06 DIAGNOSIS — Z853 Personal history of malignant neoplasm of breast: Secondary | ICD-10-CM | POA: Diagnosis not present

## 2022-02-06 DIAGNOSIS — L89320 Pressure ulcer of left buttock, unstageable: Secondary | ICD-10-CM | POA: Diagnosis not present

## 2022-02-06 DIAGNOSIS — F32A Depression, unspecified: Secondary | ICD-10-CM | POA: Diagnosis not present

## 2022-02-06 DIAGNOSIS — F02811 Dementia in other diseases classified elsewhere, unspecified severity, with agitation: Secondary | ICD-10-CM | POA: Diagnosis not present

## 2022-02-06 DIAGNOSIS — Z8616 Personal history of COVID-19: Secondary | ICD-10-CM | POA: Diagnosis not present

## 2022-02-06 DIAGNOSIS — R634 Abnormal weight loss: Secondary | ICD-10-CM | POA: Diagnosis not present

## 2022-02-06 DIAGNOSIS — Z681 Body mass index (BMI) 19 or less, adult: Secondary | ICD-10-CM | POA: Diagnosis not present

## 2022-02-06 DIAGNOSIS — L89150 Pressure ulcer of sacral region, unstageable: Secondary | ICD-10-CM | POA: Diagnosis not present

## 2022-02-06 DIAGNOSIS — E43 Unspecified severe protein-calorie malnutrition: Secondary | ICD-10-CM | POA: Diagnosis not present

## 2022-02-06 DIAGNOSIS — R410 Disorientation, unspecified: Secondary | ICD-10-CM | POA: Diagnosis not present

## 2022-02-06 DIAGNOSIS — G301 Alzheimer's disease with late onset: Secondary | ICD-10-CM | POA: Diagnosis not present

## 2022-02-08 DIAGNOSIS — F02811 Dementia in other diseases classified elsewhere, unspecified severity, with agitation: Secondary | ICD-10-CM | POA: Diagnosis not present

## 2022-02-08 DIAGNOSIS — Z681 Body mass index (BMI) 19 or less, adult: Secondary | ICD-10-CM | POA: Diagnosis not present

## 2022-02-08 DIAGNOSIS — G301 Alzheimer's disease with late onset: Secondary | ICD-10-CM | POA: Diagnosis not present

## 2022-02-08 DIAGNOSIS — F0284 Dementia in other diseases classified elsewhere, unspecified severity, with anxiety: Secondary | ICD-10-CM | POA: Diagnosis not present

## 2022-02-08 DIAGNOSIS — R634 Abnormal weight loss: Secondary | ICD-10-CM | POA: Diagnosis not present

## 2022-02-08 DIAGNOSIS — E43 Unspecified severe protein-calorie malnutrition: Secondary | ICD-10-CM | POA: Diagnosis not present

## 2022-02-11 DIAGNOSIS — F0284 Dementia in other diseases classified elsewhere, unspecified severity, with anxiety: Secondary | ICD-10-CM | POA: Diagnosis not present

## 2022-02-11 DIAGNOSIS — G301 Alzheimer's disease with late onset: Secondary | ICD-10-CM | POA: Diagnosis not present

## 2022-02-11 DIAGNOSIS — Z681 Body mass index (BMI) 19 or less, adult: Secondary | ICD-10-CM | POA: Diagnosis not present

## 2022-02-11 DIAGNOSIS — R634 Abnormal weight loss: Secondary | ICD-10-CM | POA: Diagnosis not present

## 2022-02-11 DIAGNOSIS — E43 Unspecified severe protein-calorie malnutrition: Secondary | ICD-10-CM | POA: Diagnosis not present

## 2022-02-11 DIAGNOSIS — F02811 Dementia in other diseases classified elsewhere, unspecified severity, with agitation: Secondary | ICD-10-CM | POA: Diagnosis not present

## 2022-02-12 DIAGNOSIS — R634 Abnormal weight loss: Secondary | ICD-10-CM | POA: Diagnosis not present

## 2022-02-12 DIAGNOSIS — F02811 Dementia in other diseases classified elsewhere, unspecified severity, with agitation: Secondary | ICD-10-CM | POA: Diagnosis not present

## 2022-02-12 DIAGNOSIS — E43 Unspecified severe protein-calorie malnutrition: Secondary | ICD-10-CM | POA: Diagnosis not present

## 2022-02-12 DIAGNOSIS — Z681 Body mass index (BMI) 19 or less, adult: Secondary | ICD-10-CM | POA: Diagnosis not present

## 2022-02-12 DIAGNOSIS — G301 Alzheimer's disease with late onset: Secondary | ICD-10-CM | POA: Diagnosis not present

## 2022-02-12 DIAGNOSIS — F0284 Dementia in other diseases classified elsewhere, unspecified severity, with anxiety: Secondary | ICD-10-CM | POA: Diagnosis not present

## 2022-02-15 DIAGNOSIS — E43 Unspecified severe protein-calorie malnutrition: Secondary | ICD-10-CM | POA: Diagnosis not present

## 2022-02-15 DIAGNOSIS — G301 Alzheimer's disease with late onset: Secondary | ICD-10-CM | POA: Diagnosis not present

## 2022-02-15 DIAGNOSIS — Z681 Body mass index (BMI) 19 or less, adult: Secondary | ICD-10-CM | POA: Diagnosis not present

## 2022-02-15 DIAGNOSIS — F0284 Dementia in other diseases classified elsewhere, unspecified severity, with anxiety: Secondary | ICD-10-CM | POA: Diagnosis not present

## 2022-02-15 DIAGNOSIS — R634 Abnormal weight loss: Secondary | ICD-10-CM | POA: Diagnosis not present

## 2022-02-15 DIAGNOSIS — F02811 Dementia in other diseases classified elsewhere, unspecified severity, with agitation: Secondary | ICD-10-CM | POA: Diagnosis not present

## 2022-02-17 DIAGNOSIS — E43 Unspecified severe protein-calorie malnutrition: Secondary | ICD-10-CM | POA: Diagnosis not present

## 2022-02-17 DIAGNOSIS — Z681 Body mass index (BMI) 19 or less, adult: Secondary | ICD-10-CM | POA: Diagnosis not present

## 2022-02-17 DIAGNOSIS — F0284 Dementia in other diseases classified elsewhere, unspecified severity, with anxiety: Secondary | ICD-10-CM | POA: Diagnosis not present

## 2022-02-17 DIAGNOSIS — F02811 Dementia in other diseases classified elsewhere, unspecified severity, with agitation: Secondary | ICD-10-CM | POA: Diagnosis not present

## 2022-02-17 DIAGNOSIS — R634 Abnormal weight loss: Secondary | ICD-10-CM | POA: Diagnosis not present

## 2022-02-17 DIAGNOSIS — G301 Alzheimer's disease with late onset: Secondary | ICD-10-CM | POA: Diagnosis not present

## 2022-02-18 DIAGNOSIS — F02811 Dementia in other diseases classified elsewhere, unspecified severity, with agitation: Secondary | ICD-10-CM | POA: Diagnosis not present

## 2022-02-18 DIAGNOSIS — R634 Abnormal weight loss: Secondary | ICD-10-CM | POA: Diagnosis not present

## 2022-02-18 DIAGNOSIS — E43 Unspecified severe protein-calorie malnutrition: Secondary | ICD-10-CM | POA: Diagnosis not present

## 2022-02-18 DIAGNOSIS — G301 Alzheimer's disease with late onset: Secondary | ICD-10-CM | POA: Diagnosis not present

## 2022-02-18 DIAGNOSIS — F0284 Dementia in other diseases classified elsewhere, unspecified severity, with anxiety: Secondary | ICD-10-CM | POA: Diagnosis not present

## 2022-02-18 DIAGNOSIS — Z681 Body mass index (BMI) 19 or less, adult: Secondary | ICD-10-CM | POA: Diagnosis not present

## 2022-02-22 DIAGNOSIS — F0284 Dementia in other diseases classified elsewhere, unspecified severity, with anxiety: Secondary | ICD-10-CM | POA: Diagnosis not present

## 2022-02-22 DIAGNOSIS — F02811 Dementia in other diseases classified elsewhere, unspecified severity, with agitation: Secondary | ICD-10-CM | POA: Diagnosis not present

## 2022-02-22 DIAGNOSIS — G301 Alzheimer's disease with late onset: Secondary | ICD-10-CM | POA: Diagnosis not present

## 2022-02-22 DIAGNOSIS — E43 Unspecified severe protein-calorie malnutrition: Secondary | ICD-10-CM | POA: Diagnosis not present

## 2022-02-22 DIAGNOSIS — R634 Abnormal weight loss: Secondary | ICD-10-CM | POA: Diagnosis not present

## 2022-02-22 DIAGNOSIS — Z681 Body mass index (BMI) 19 or less, adult: Secondary | ICD-10-CM | POA: Diagnosis not present

## 2022-02-25 DIAGNOSIS — R634 Abnormal weight loss: Secondary | ICD-10-CM | POA: Diagnosis not present

## 2022-02-25 DIAGNOSIS — E43 Unspecified severe protein-calorie malnutrition: Secondary | ICD-10-CM | POA: Diagnosis not present

## 2022-02-25 DIAGNOSIS — F0284 Dementia in other diseases classified elsewhere, unspecified severity, with anxiety: Secondary | ICD-10-CM | POA: Diagnosis not present

## 2022-02-25 DIAGNOSIS — G301 Alzheimer's disease with late onset: Secondary | ICD-10-CM | POA: Diagnosis not present

## 2022-02-25 DIAGNOSIS — F02811 Dementia in other diseases classified elsewhere, unspecified severity, with agitation: Secondary | ICD-10-CM | POA: Diagnosis not present

## 2022-02-25 DIAGNOSIS — Z681 Body mass index (BMI) 19 or less, adult: Secondary | ICD-10-CM | POA: Diagnosis not present

## 2022-03-01 DIAGNOSIS — E43 Unspecified severe protein-calorie malnutrition: Secondary | ICD-10-CM | POA: Diagnosis not present

## 2022-03-01 DIAGNOSIS — Z681 Body mass index (BMI) 19 or less, adult: Secondary | ICD-10-CM | POA: Diagnosis not present

## 2022-03-01 DIAGNOSIS — G301 Alzheimer's disease with late onset: Secondary | ICD-10-CM | POA: Diagnosis not present

## 2022-03-01 DIAGNOSIS — F02811 Dementia in other diseases classified elsewhere, unspecified severity, with agitation: Secondary | ICD-10-CM | POA: Diagnosis not present

## 2022-03-01 DIAGNOSIS — R634 Abnormal weight loss: Secondary | ICD-10-CM | POA: Diagnosis not present

## 2022-03-01 DIAGNOSIS — F0284 Dementia in other diseases classified elsewhere, unspecified severity, with anxiety: Secondary | ICD-10-CM | POA: Diagnosis not present

## 2022-03-04 DIAGNOSIS — F02811 Dementia in other diseases classified elsewhere, unspecified severity, with agitation: Secondary | ICD-10-CM | POA: Diagnosis not present

## 2022-03-04 DIAGNOSIS — Z681 Body mass index (BMI) 19 or less, adult: Secondary | ICD-10-CM | POA: Diagnosis not present

## 2022-03-04 DIAGNOSIS — R634 Abnormal weight loss: Secondary | ICD-10-CM | POA: Diagnosis not present

## 2022-03-04 DIAGNOSIS — G301 Alzheimer's disease with late onset: Secondary | ICD-10-CM | POA: Diagnosis not present

## 2022-03-04 DIAGNOSIS — E43 Unspecified severe protein-calorie malnutrition: Secondary | ICD-10-CM | POA: Diagnosis not present

## 2022-03-04 DIAGNOSIS — F0284 Dementia in other diseases classified elsewhere, unspecified severity, with anxiety: Secondary | ICD-10-CM | POA: Diagnosis not present

## 2022-03-08 DIAGNOSIS — F0284 Dementia in other diseases classified elsewhere, unspecified severity, with anxiety: Secondary | ICD-10-CM | POA: Diagnosis not present

## 2022-03-08 DIAGNOSIS — G301 Alzheimer's disease with late onset: Secondary | ICD-10-CM | POA: Diagnosis not present

## 2022-03-08 DIAGNOSIS — F02811 Dementia in other diseases classified elsewhere, unspecified severity, with agitation: Secondary | ICD-10-CM | POA: Diagnosis not present

## 2022-03-08 DIAGNOSIS — Z681 Body mass index (BMI) 19 or less, adult: Secondary | ICD-10-CM | POA: Diagnosis not present

## 2022-03-08 DIAGNOSIS — E43 Unspecified severe protein-calorie malnutrition: Secondary | ICD-10-CM | POA: Diagnosis not present

## 2022-03-08 DIAGNOSIS — R634 Abnormal weight loss: Secondary | ICD-10-CM | POA: Diagnosis not present

## 2022-03-09 DIAGNOSIS — F0284 Dementia in other diseases classified elsewhere, unspecified severity, with anxiety: Secondary | ICD-10-CM | POA: Diagnosis not present

## 2022-03-09 DIAGNOSIS — L89320 Pressure ulcer of left buttock, unstageable: Secondary | ICD-10-CM | POA: Diagnosis not present

## 2022-03-09 DIAGNOSIS — F02811 Dementia in other diseases classified elsewhere, unspecified severity, with agitation: Secondary | ICD-10-CM | POA: Diagnosis not present

## 2022-03-09 DIAGNOSIS — L89616 Pressure-induced deep tissue damage of right heel: Secondary | ICD-10-CM | POA: Diagnosis not present

## 2022-03-09 DIAGNOSIS — G301 Alzheimer's disease with late onset: Secondary | ICD-10-CM | POA: Diagnosis not present

## 2022-03-09 DIAGNOSIS — Z853 Personal history of malignant neoplasm of breast: Secondary | ICD-10-CM | POA: Diagnosis not present

## 2022-03-09 DIAGNOSIS — E43 Unspecified severe protein-calorie malnutrition: Secondary | ICD-10-CM | POA: Diagnosis not present

## 2022-03-09 DIAGNOSIS — F32A Depression, unspecified: Secondary | ICD-10-CM | POA: Diagnosis not present

## 2022-03-09 DIAGNOSIS — R634 Abnormal weight loss: Secondary | ICD-10-CM | POA: Diagnosis not present

## 2022-03-09 DIAGNOSIS — L89310 Pressure ulcer of right buttock, unstageable: Secondary | ICD-10-CM | POA: Diagnosis not present

## 2022-03-09 DIAGNOSIS — L89150 Pressure ulcer of sacral region, unstageable: Secondary | ICD-10-CM | POA: Diagnosis not present

## 2022-03-09 DIAGNOSIS — Z681 Body mass index (BMI) 19 or less, adult: Secondary | ICD-10-CM | POA: Diagnosis not present

## 2022-03-09 DIAGNOSIS — R410 Disorientation, unspecified: Secondary | ICD-10-CM | POA: Diagnosis not present

## 2022-03-09 DIAGNOSIS — Z8616 Personal history of COVID-19: Secondary | ICD-10-CM | POA: Diagnosis not present

## 2022-03-09 DIAGNOSIS — Z8679 Personal history of other diseases of the circulatory system: Secondary | ICD-10-CM | POA: Diagnosis not present

## 2022-03-11 DIAGNOSIS — Z681 Body mass index (BMI) 19 or less, adult: Secondary | ICD-10-CM | POA: Diagnosis not present

## 2022-03-11 DIAGNOSIS — R634 Abnormal weight loss: Secondary | ICD-10-CM | POA: Diagnosis not present

## 2022-03-11 DIAGNOSIS — G301 Alzheimer's disease with late onset: Secondary | ICD-10-CM | POA: Diagnosis not present

## 2022-03-11 DIAGNOSIS — E43 Unspecified severe protein-calorie malnutrition: Secondary | ICD-10-CM | POA: Diagnosis not present

## 2022-03-11 DIAGNOSIS — F02811 Dementia in other diseases classified elsewhere, unspecified severity, with agitation: Secondary | ICD-10-CM | POA: Diagnosis not present

## 2022-03-11 DIAGNOSIS — F0284 Dementia in other diseases classified elsewhere, unspecified severity, with anxiety: Secondary | ICD-10-CM | POA: Diagnosis not present

## 2022-03-15 DIAGNOSIS — E43 Unspecified severe protein-calorie malnutrition: Secondary | ICD-10-CM | POA: Diagnosis not present

## 2022-03-15 DIAGNOSIS — Z681 Body mass index (BMI) 19 or less, adult: Secondary | ICD-10-CM | POA: Diagnosis not present

## 2022-03-15 DIAGNOSIS — F0284 Dementia in other diseases classified elsewhere, unspecified severity, with anxiety: Secondary | ICD-10-CM | POA: Diagnosis not present

## 2022-03-15 DIAGNOSIS — F02811 Dementia in other diseases classified elsewhere, unspecified severity, with agitation: Secondary | ICD-10-CM | POA: Diagnosis not present

## 2022-03-15 DIAGNOSIS — G301 Alzheimer's disease with late onset: Secondary | ICD-10-CM | POA: Diagnosis not present

## 2022-03-15 DIAGNOSIS — R634 Abnormal weight loss: Secondary | ICD-10-CM | POA: Diagnosis not present

## 2022-03-18 DIAGNOSIS — R634 Abnormal weight loss: Secondary | ICD-10-CM | POA: Diagnosis not present

## 2022-03-18 DIAGNOSIS — Z681 Body mass index (BMI) 19 or less, adult: Secondary | ICD-10-CM | POA: Diagnosis not present

## 2022-03-18 DIAGNOSIS — F0284 Dementia in other diseases classified elsewhere, unspecified severity, with anxiety: Secondary | ICD-10-CM | POA: Diagnosis not present

## 2022-03-18 DIAGNOSIS — F02811 Dementia in other diseases classified elsewhere, unspecified severity, with agitation: Secondary | ICD-10-CM | POA: Diagnosis not present

## 2022-03-18 DIAGNOSIS — E43 Unspecified severe protein-calorie malnutrition: Secondary | ICD-10-CM | POA: Diagnosis not present

## 2022-03-18 DIAGNOSIS — G301 Alzheimer's disease with late onset: Secondary | ICD-10-CM | POA: Diagnosis not present

## 2022-03-22 DIAGNOSIS — R634 Abnormal weight loss: Secondary | ICD-10-CM | POA: Diagnosis not present

## 2022-03-22 DIAGNOSIS — E43 Unspecified severe protein-calorie malnutrition: Secondary | ICD-10-CM | POA: Diagnosis not present

## 2022-03-22 DIAGNOSIS — Z681 Body mass index (BMI) 19 or less, adult: Secondary | ICD-10-CM | POA: Diagnosis not present

## 2022-03-22 DIAGNOSIS — G301 Alzheimer's disease with late onset: Secondary | ICD-10-CM | POA: Diagnosis not present

## 2022-03-22 DIAGNOSIS — F02811 Dementia in other diseases classified elsewhere, unspecified severity, with agitation: Secondary | ICD-10-CM | POA: Diagnosis not present

## 2022-03-22 DIAGNOSIS — F0284 Dementia in other diseases classified elsewhere, unspecified severity, with anxiety: Secondary | ICD-10-CM | POA: Diagnosis not present

## 2022-03-25 DIAGNOSIS — Z681 Body mass index (BMI) 19 or less, adult: Secondary | ICD-10-CM | POA: Diagnosis not present

## 2022-03-25 DIAGNOSIS — F02811 Dementia in other diseases classified elsewhere, unspecified severity, with agitation: Secondary | ICD-10-CM | POA: Diagnosis not present

## 2022-03-25 DIAGNOSIS — E43 Unspecified severe protein-calorie malnutrition: Secondary | ICD-10-CM | POA: Diagnosis not present

## 2022-03-25 DIAGNOSIS — G301 Alzheimer's disease with late onset: Secondary | ICD-10-CM | POA: Diagnosis not present

## 2022-03-25 DIAGNOSIS — R634 Abnormal weight loss: Secondary | ICD-10-CM | POA: Diagnosis not present

## 2022-03-25 DIAGNOSIS — F0284 Dementia in other diseases classified elsewhere, unspecified severity, with anxiety: Secondary | ICD-10-CM | POA: Diagnosis not present

## 2022-03-29 DIAGNOSIS — Z681 Body mass index (BMI) 19 or less, adult: Secondary | ICD-10-CM | POA: Diagnosis not present

## 2022-03-29 DIAGNOSIS — F02811 Dementia in other diseases classified elsewhere, unspecified severity, with agitation: Secondary | ICD-10-CM | POA: Diagnosis not present

## 2022-03-29 DIAGNOSIS — F0284 Dementia in other diseases classified elsewhere, unspecified severity, with anxiety: Secondary | ICD-10-CM | POA: Diagnosis not present

## 2022-03-29 DIAGNOSIS — R634 Abnormal weight loss: Secondary | ICD-10-CM | POA: Diagnosis not present

## 2022-03-29 DIAGNOSIS — E43 Unspecified severe protein-calorie malnutrition: Secondary | ICD-10-CM | POA: Diagnosis not present

## 2022-03-29 DIAGNOSIS — G301 Alzheimer's disease with late onset: Secondary | ICD-10-CM | POA: Diagnosis not present

## 2022-04-01 DIAGNOSIS — F02811 Dementia in other diseases classified elsewhere, unspecified severity, with agitation: Secondary | ICD-10-CM | POA: Diagnosis not present

## 2022-04-01 DIAGNOSIS — G301 Alzheimer's disease with late onset: Secondary | ICD-10-CM | POA: Diagnosis not present

## 2022-04-01 DIAGNOSIS — F0284 Dementia in other diseases classified elsewhere, unspecified severity, with anxiety: Secondary | ICD-10-CM | POA: Diagnosis not present

## 2022-04-01 DIAGNOSIS — Z681 Body mass index (BMI) 19 or less, adult: Secondary | ICD-10-CM | POA: Diagnosis not present

## 2022-04-01 DIAGNOSIS — E43 Unspecified severe protein-calorie malnutrition: Secondary | ICD-10-CM | POA: Diagnosis not present

## 2022-04-01 DIAGNOSIS — R634 Abnormal weight loss: Secondary | ICD-10-CM | POA: Diagnosis not present

## 2022-04-05 DIAGNOSIS — F0284 Dementia in other diseases classified elsewhere, unspecified severity, with anxiety: Secondary | ICD-10-CM | POA: Diagnosis not present

## 2022-04-05 DIAGNOSIS — G301 Alzheimer's disease with late onset: Secondary | ICD-10-CM | POA: Diagnosis not present

## 2022-04-05 DIAGNOSIS — E43 Unspecified severe protein-calorie malnutrition: Secondary | ICD-10-CM | POA: Diagnosis not present

## 2022-04-05 DIAGNOSIS — R634 Abnormal weight loss: Secondary | ICD-10-CM | POA: Diagnosis not present

## 2022-04-05 DIAGNOSIS — Z681 Body mass index (BMI) 19 or less, adult: Secondary | ICD-10-CM | POA: Diagnosis not present

## 2022-04-05 DIAGNOSIS — F02811 Dementia in other diseases classified elsewhere, unspecified severity, with agitation: Secondary | ICD-10-CM | POA: Diagnosis not present

## 2022-04-08 DIAGNOSIS — G301 Alzheimer's disease with late onset: Secondary | ICD-10-CM | POA: Diagnosis not present

## 2022-04-08 DIAGNOSIS — R634 Abnormal weight loss: Secondary | ICD-10-CM | POA: Diagnosis not present

## 2022-04-08 DIAGNOSIS — F0284 Dementia in other diseases classified elsewhere, unspecified severity, with anxiety: Secondary | ICD-10-CM | POA: Diagnosis not present

## 2022-04-08 DIAGNOSIS — E43 Unspecified severe protein-calorie malnutrition: Secondary | ICD-10-CM | POA: Diagnosis not present

## 2022-04-08 DIAGNOSIS — Z681 Body mass index (BMI) 19 or less, adult: Secondary | ICD-10-CM | POA: Diagnosis not present

## 2022-04-08 DIAGNOSIS — F02811 Dementia in other diseases classified elsewhere, unspecified severity, with agitation: Secondary | ICD-10-CM | POA: Diagnosis not present

## 2022-04-09 DIAGNOSIS — F02811 Dementia in other diseases classified elsewhere, unspecified severity, with agitation: Secondary | ICD-10-CM | POA: Diagnosis not present

## 2022-04-09 DIAGNOSIS — Z8679 Personal history of other diseases of the circulatory system: Secondary | ICD-10-CM | POA: Diagnosis not present

## 2022-04-09 DIAGNOSIS — R410 Disorientation, unspecified: Secondary | ICD-10-CM | POA: Diagnosis not present

## 2022-04-09 DIAGNOSIS — R634 Abnormal weight loss: Secondary | ICD-10-CM | POA: Diagnosis not present

## 2022-04-09 DIAGNOSIS — L89320 Pressure ulcer of left buttock, unstageable: Secondary | ICD-10-CM | POA: Diagnosis not present

## 2022-04-09 DIAGNOSIS — Z853 Personal history of malignant neoplasm of breast: Secondary | ICD-10-CM | POA: Diagnosis not present

## 2022-04-09 DIAGNOSIS — L89310 Pressure ulcer of right buttock, unstageable: Secondary | ICD-10-CM | POA: Diagnosis not present

## 2022-04-09 DIAGNOSIS — F0284 Dementia in other diseases classified elsewhere, unspecified severity, with anxiety: Secondary | ICD-10-CM | POA: Diagnosis not present

## 2022-04-09 DIAGNOSIS — L89616 Pressure-induced deep tissue damage of right heel: Secondary | ICD-10-CM | POA: Diagnosis not present

## 2022-04-09 DIAGNOSIS — E43 Unspecified severe protein-calorie malnutrition: Secondary | ICD-10-CM | POA: Diagnosis not present

## 2022-04-09 DIAGNOSIS — Z8616 Personal history of COVID-19: Secondary | ICD-10-CM | POA: Diagnosis not present

## 2022-04-09 DIAGNOSIS — Z681 Body mass index (BMI) 19 or less, adult: Secondary | ICD-10-CM | POA: Diagnosis not present

## 2022-04-09 DIAGNOSIS — L89150 Pressure ulcer of sacral region, unstageable: Secondary | ICD-10-CM | POA: Diagnosis not present

## 2022-04-09 DIAGNOSIS — G301 Alzheimer's disease with late onset: Secondary | ICD-10-CM | POA: Diagnosis not present

## 2022-04-09 DIAGNOSIS — F32A Depression, unspecified: Secondary | ICD-10-CM | POA: Diagnosis not present

## 2022-04-12 DIAGNOSIS — F02811 Dementia in other diseases classified elsewhere, unspecified severity, with agitation: Secondary | ICD-10-CM | POA: Diagnosis not present

## 2022-04-12 DIAGNOSIS — Z681 Body mass index (BMI) 19 or less, adult: Secondary | ICD-10-CM | POA: Diagnosis not present

## 2022-04-12 DIAGNOSIS — E43 Unspecified severe protein-calorie malnutrition: Secondary | ICD-10-CM | POA: Diagnosis not present

## 2022-04-12 DIAGNOSIS — G301 Alzheimer's disease with late onset: Secondary | ICD-10-CM | POA: Diagnosis not present

## 2022-04-12 DIAGNOSIS — F0284 Dementia in other diseases classified elsewhere, unspecified severity, with anxiety: Secondary | ICD-10-CM | POA: Diagnosis not present

## 2022-04-12 DIAGNOSIS — R634 Abnormal weight loss: Secondary | ICD-10-CM | POA: Diagnosis not present

## 2022-04-13 DIAGNOSIS — R634 Abnormal weight loss: Secondary | ICD-10-CM | POA: Diagnosis not present

## 2022-04-13 DIAGNOSIS — F0284 Dementia in other diseases classified elsewhere, unspecified severity, with anxiety: Secondary | ICD-10-CM | POA: Diagnosis not present

## 2022-04-13 DIAGNOSIS — F02811 Dementia in other diseases classified elsewhere, unspecified severity, with agitation: Secondary | ICD-10-CM | POA: Diagnosis not present

## 2022-04-13 DIAGNOSIS — Z681 Body mass index (BMI) 19 or less, adult: Secondary | ICD-10-CM | POA: Diagnosis not present

## 2022-04-13 DIAGNOSIS — E43 Unspecified severe protein-calorie malnutrition: Secondary | ICD-10-CM | POA: Diagnosis not present

## 2022-04-13 DIAGNOSIS — G301 Alzheimer's disease with late onset: Secondary | ICD-10-CM | POA: Diagnosis not present

## 2022-04-15 DIAGNOSIS — E43 Unspecified severe protein-calorie malnutrition: Secondary | ICD-10-CM | POA: Diagnosis not present

## 2022-04-15 DIAGNOSIS — F02811 Dementia in other diseases classified elsewhere, unspecified severity, with agitation: Secondary | ICD-10-CM | POA: Diagnosis not present

## 2022-04-15 DIAGNOSIS — Z681 Body mass index (BMI) 19 or less, adult: Secondary | ICD-10-CM | POA: Diagnosis not present

## 2022-04-15 DIAGNOSIS — F0284 Dementia in other diseases classified elsewhere, unspecified severity, with anxiety: Secondary | ICD-10-CM | POA: Diagnosis not present

## 2022-04-15 DIAGNOSIS — R634 Abnormal weight loss: Secondary | ICD-10-CM | POA: Diagnosis not present

## 2022-04-15 DIAGNOSIS — G301 Alzheimer's disease with late onset: Secondary | ICD-10-CM | POA: Diagnosis not present

## 2022-04-15 IMAGING — CT CT HEAD W/O CM
3 series · 14 of 47 positions shown, 16 images · non-contrast
Comparison: None.

CLINICAL DATA: Dementia. Fall from bed. Confusion.

EXAM:
CT HEAD WITHOUT CONTRAST
TECHNIQUE: Contiguous axial images were obtained from the base of the skull
through the vertex without intravenous contrast.

[Series 3: head 5.0 h30s · axial · 0.46mm/px · z∈[-80,+55]mm · 8 of 33 slices shown, 10 images]
[im 3/33  brain]
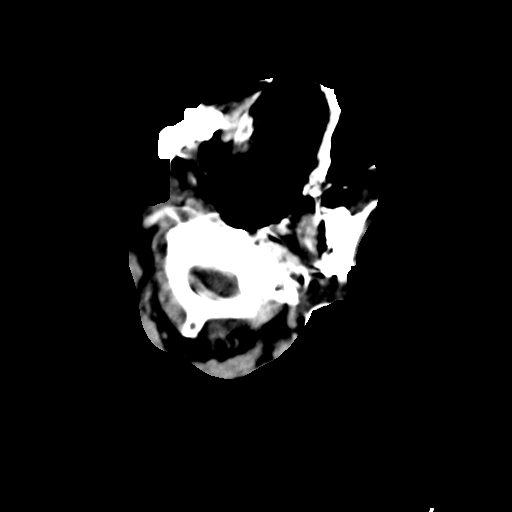
[im 3/33  bone]
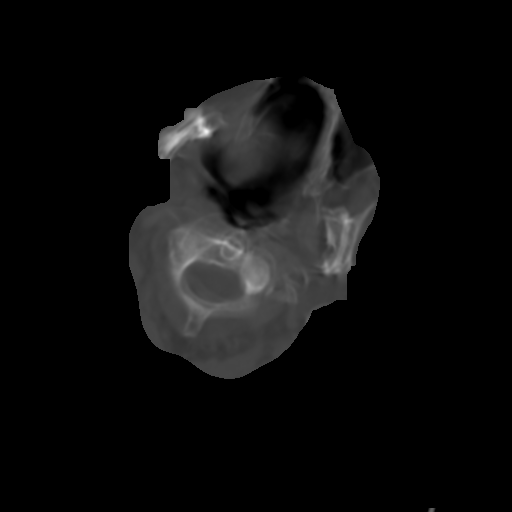
[im 7/33  brain]
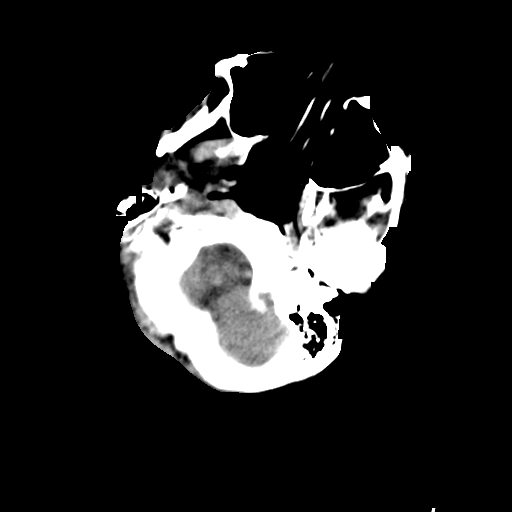
[im 10/33  brain]
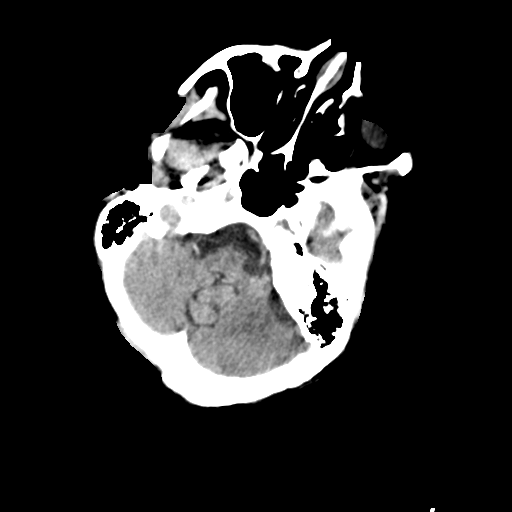
[im 15/33  brain]
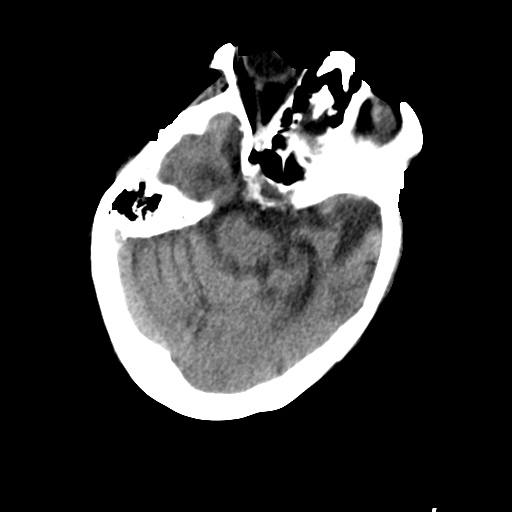
[im 18/33  brain]
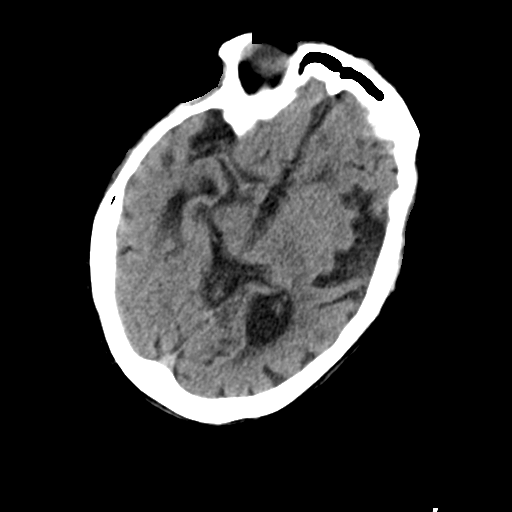
[im 18/33  bone]
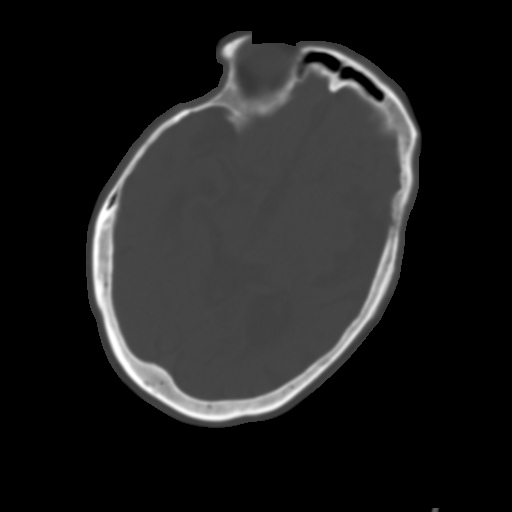
[im 23/33  brain]
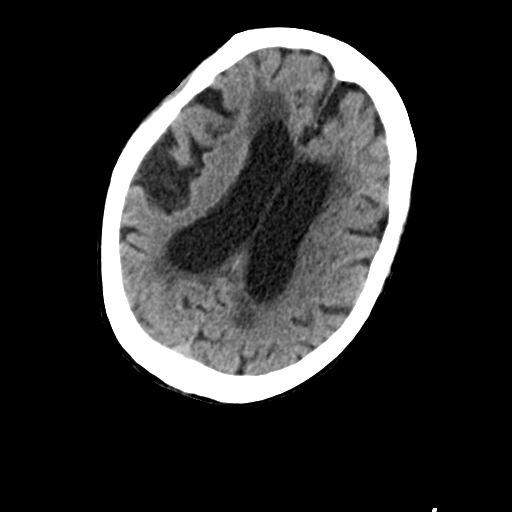
[im 26/33  brain]
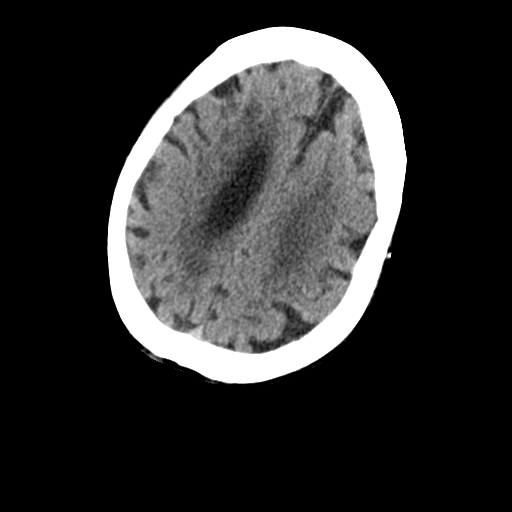
[im 30/33  brain]
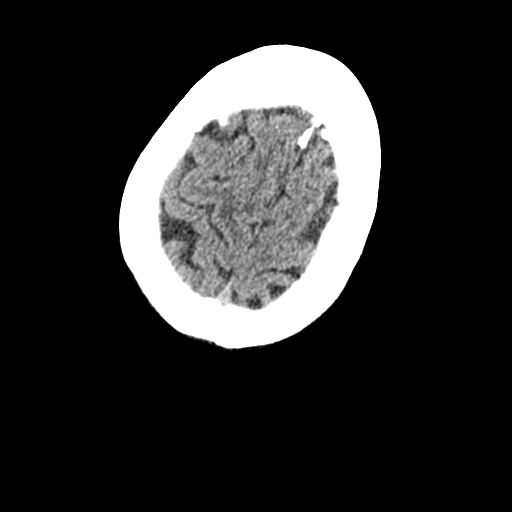

[Series 5: head 3.0 mpr cor · coronal · 0.35mm/px · 3 of 72 slices shown]
[im 24/72  brain]
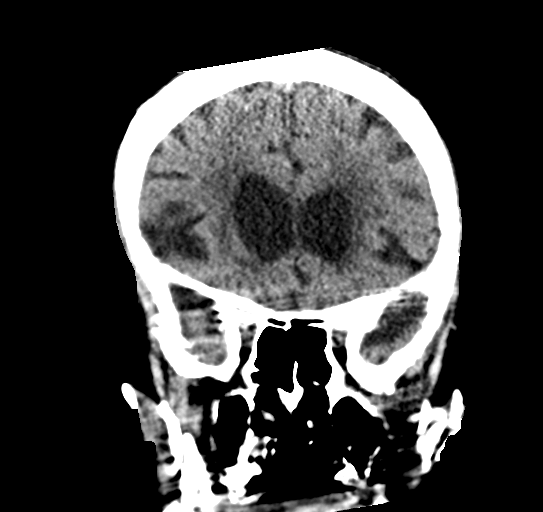
[im 32/72  brain]
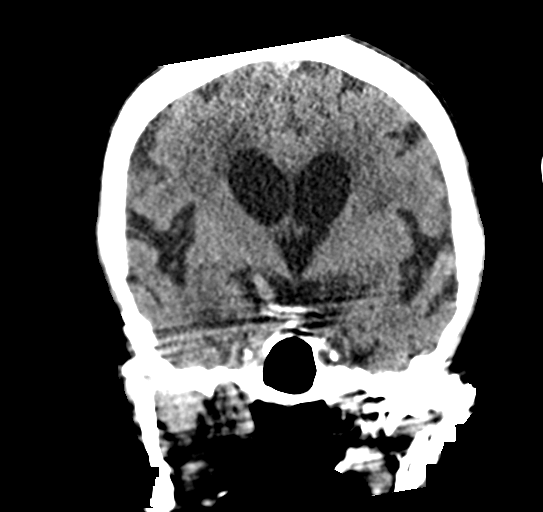
[im 40/72  brain]
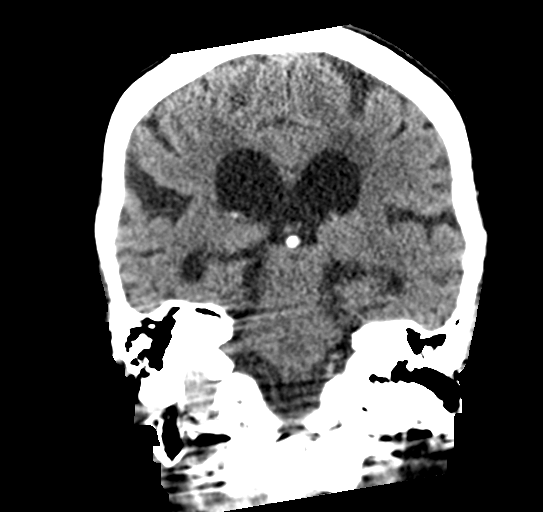

[Series 6: head 3.0 mpr sag · sagittal · 0.32mm/px · 3 of 66 slices shown]
[im 22/66  brain]
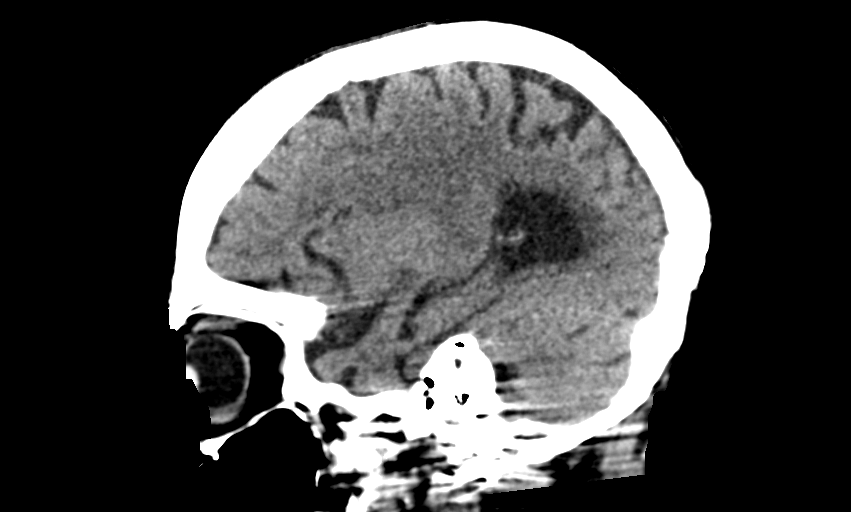
[im 33/66  brain]
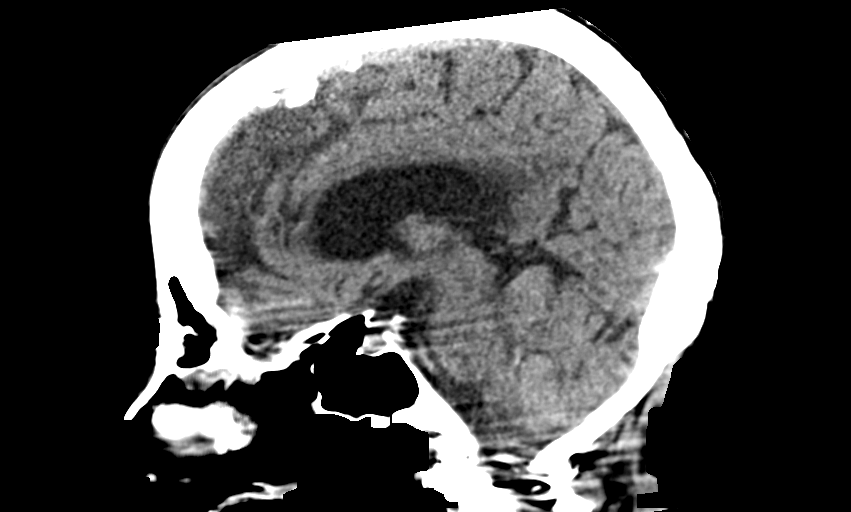
[im 44/66  brain]
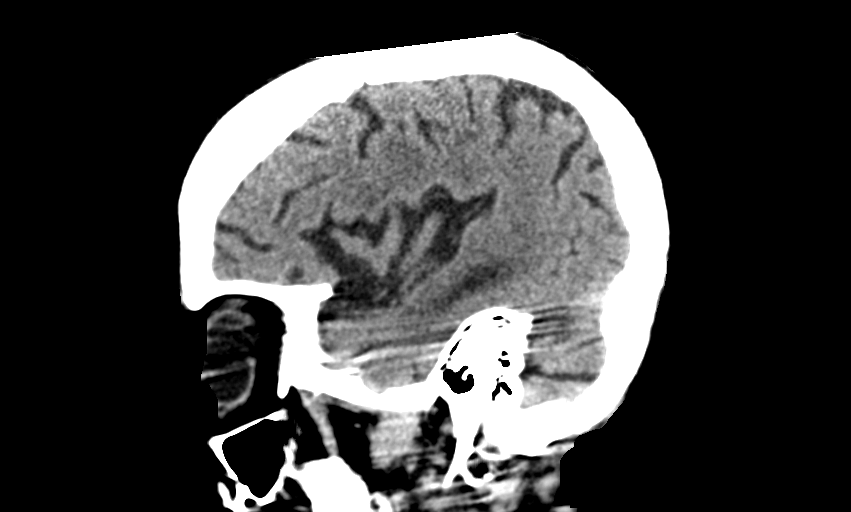

[14 of 47 positions shown; findings below may reference images not displayed]

FINDINGS: Brain: Motion degraded scan, limiting assessment. No evidence of
parenchymal hemorrhage or extra-axial fluid collection. No mass
lesion, mass effect, or midline shift. No CT evidence of acute
infarction. Generalized cerebral volume loss. Nonspecific prominent
subcortical and periventricular white matter hypodensity, most in
keeping with chronic small vessel ischemic change. Cerebral
ventricle sizes are concordant with the degree of cerebral volume
loss.

Vascular: No acute abnormality.

Skull: No evidence of calvarial fracture.

Sinuses/Orbits: The visualized paranasal sinuses are essentially
clear.

Other:  The mastoid air cells are unopacified.
IMPRESSION: 1. Limited motion degraded scan. No evidence of acute intracranial
abnormality. No evidence of calvarial fracture.
2. Generalized cerebral volume loss and prominent chronic small
vessel ischemic changes in the cerebral white matter.

## 2022-04-16 DIAGNOSIS — E43 Unspecified severe protein-calorie malnutrition: Secondary | ICD-10-CM | POA: Diagnosis not present

## 2022-04-16 DIAGNOSIS — F02811 Dementia in other diseases classified elsewhere, unspecified severity, with agitation: Secondary | ICD-10-CM | POA: Diagnosis not present

## 2022-04-16 DIAGNOSIS — F0284 Dementia in other diseases classified elsewhere, unspecified severity, with anxiety: Secondary | ICD-10-CM | POA: Diagnosis not present

## 2022-04-16 DIAGNOSIS — G301 Alzheimer's disease with late onset: Secondary | ICD-10-CM | POA: Diagnosis not present

## 2022-04-16 DIAGNOSIS — Z681 Body mass index (BMI) 19 or less, adult: Secondary | ICD-10-CM | POA: Diagnosis not present

## 2022-04-16 DIAGNOSIS — R634 Abnormal weight loss: Secondary | ICD-10-CM | POA: Diagnosis not present

## 2022-05-09 DEATH — deceased

## 2022-05-10 ENCOUNTER — Telehealth: Payer: Self-pay

## 2022-05-10 NOTE — Patient Outreach (Signed)
error
